# Patient Record
Sex: Female | Born: 1945 | Race: White | Hispanic: No | Marital: Married | Smoking: Former smoker
Health system: Southern US, Community
[De-identification: ages and names within clinical notes are randomized; demographics above are authoritative.]

## PROBLEM LIST (undated history)

## (undated) DIAGNOSIS — F419 Anxiety disorder, unspecified: Secondary | ICD-10-CM

## (undated) DIAGNOSIS — Z8719 Personal history of other diseases of the digestive system: Secondary | ICD-10-CM

## (undated) DIAGNOSIS — M1990 Unspecified osteoarthritis, unspecified site: Secondary | ICD-10-CM

## (undated) DIAGNOSIS — K219 Gastro-esophageal reflux disease without esophagitis: Secondary | ICD-10-CM

## (undated) DIAGNOSIS — M8580 Other specified disorders of bone density and structure, unspecified site: Secondary | ICD-10-CM

## (undated) HISTORY — PX: COLONOSCOPY: SHX174

## (undated) HISTORY — PX: LAPAROSCOPY: SHX197

## (undated) HISTORY — PX: TUBAL LIGATION: SHX77

---

## 1998-03-04 ENCOUNTER — Ambulatory Visit (HOSPITAL_COMMUNITY): Admission: RE | Admit: 1998-03-04 | Discharge: 1998-03-04 | Payer: Self-pay | Admitting: Surgery

## 1998-03-04 ENCOUNTER — Encounter: Payer: Self-pay | Admitting: Surgery

## 1999-06-17 HISTORY — PX: ABDOMINAL HYSTERECTOMY: SHX81

## 1999-07-01 ENCOUNTER — Encounter (INDEPENDENT_AMBULATORY_CARE_PROVIDER_SITE_OTHER): Payer: Self-pay

## 1999-07-01 ENCOUNTER — Inpatient Hospital Stay (HOSPITAL_COMMUNITY): Admission: RE | Admit: 1999-07-01 | Discharge: 1999-07-03 | Payer: Self-pay | Admitting: Obstetrics and Gynecology

## 2000-12-23 ENCOUNTER — Encounter: Payer: Self-pay | Admitting: Internal Medicine

## 2000-12-23 ENCOUNTER — Other Ambulatory Visit: Admission: RE | Admit: 2000-12-23 | Discharge: 2000-12-23 | Payer: Self-pay | Admitting: Obstetrics and Gynecology

## 2001-01-05 ENCOUNTER — Ambulatory Visit (HOSPITAL_COMMUNITY): Admission: RE | Admit: 2001-01-05 | Discharge: 2001-01-05 | Payer: Self-pay | Admitting: Internal Medicine

## 2001-01-05 ENCOUNTER — Encounter (INDEPENDENT_AMBULATORY_CARE_PROVIDER_SITE_OTHER): Payer: Self-pay

## 2001-01-05 ENCOUNTER — Encounter: Payer: Self-pay | Admitting: Internal Medicine

## 2002-04-07 ENCOUNTER — Other Ambulatory Visit: Admission: RE | Admit: 2002-04-07 | Discharge: 2002-04-07 | Payer: Self-pay | Admitting: Obstetrics and Gynecology

## 2003-05-02 ENCOUNTER — Other Ambulatory Visit: Admission: RE | Admit: 2003-05-02 | Discharge: 2003-05-02 | Payer: Self-pay | Admitting: Obstetrics and Gynecology

## 2004-05-02 ENCOUNTER — Other Ambulatory Visit: Admission: RE | Admit: 2004-05-02 | Discharge: 2004-05-02 | Payer: Self-pay | Admitting: Obstetrics and Gynecology

## 2005-05-06 ENCOUNTER — Other Ambulatory Visit: Admission: RE | Admit: 2005-05-06 | Discharge: 2005-05-06 | Payer: Self-pay | Admitting: Obstetrics and Gynecology

## 2006-03-10 ENCOUNTER — Ambulatory Visit: Payer: Self-pay | Admitting: Internal Medicine

## 2006-03-24 ENCOUNTER — Ambulatory Visit: Payer: Self-pay | Admitting: Internal Medicine

## 2006-05-20 ENCOUNTER — Other Ambulatory Visit: Admission: RE | Admit: 2006-05-20 | Discharge: 2006-05-20 | Payer: Self-pay | Admitting: Obstetrics and Gynecology

## 2007-08-25 ENCOUNTER — Other Ambulatory Visit: Admission: RE | Admit: 2007-08-25 | Discharge: 2007-08-25 | Payer: Self-pay | Admitting: Obstetrics and Gynecology

## 2008-10-12 ENCOUNTER — Other Ambulatory Visit: Admission: RE | Admit: 2008-10-12 | Discharge: 2008-10-12 | Payer: Self-pay | Admitting: Emergency Medicine

## 2008-10-12 ENCOUNTER — Ambulatory Visit: Payer: Self-pay | Admitting: Obstetrics and Gynecology

## 2008-10-12 ENCOUNTER — Encounter: Payer: Self-pay | Admitting: Obstetrics and Gynecology

## 2008-10-24 ENCOUNTER — Ambulatory Visit: Payer: Self-pay | Admitting: Obstetrics and Gynecology

## 2009-04-11 ENCOUNTER — Encounter (INDEPENDENT_AMBULATORY_CARE_PROVIDER_SITE_OTHER): Payer: Self-pay | Admitting: *Deleted

## 2009-04-11 ENCOUNTER — Encounter: Payer: Self-pay | Admitting: Internal Medicine

## 2009-05-24 DIAGNOSIS — K648 Other hemorrhoids: Secondary | ICD-10-CM | POA: Insufficient documentation

## 2009-05-24 DIAGNOSIS — K589 Irritable bowel syndrome without diarrhea: Secondary | ICD-10-CM | POA: Insufficient documentation

## 2009-05-24 DIAGNOSIS — M1990 Unspecified osteoarthritis, unspecified site: Secondary | ICD-10-CM | POA: Insufficient documentation

## 2009-05-24 DIAGNOSIS — K449 Diaphragmatic hernia without obstruction or gangrene: Secondary | ICD-10-CM | POA: Insufficient documentation

## 2009-05-24 DIAGNOSIS — K219 Gastro-esophageal reflux disease without esophagitis: Secondary | ICD-10-CM | POA: Insufficient documentation

## 2009-05-30 ENCOUNTER — Ambulatory Visit: Payer: Self-pay | Admitting: Internal Medicine

## 2009-05-30 DIAGNOSIS — R143 Flatulence: Secondary | ICD-10-CM

## 2009-05-30 DIAGNOSIS — R142 Eructation: Secondary | ICD-10-CM

## 2009-05-30 DIAGNOSIS — K5900 Constipation, unspecified: Secondary | ICD-10-CM | POA: Insufficient documentation

## 2009-05-30 DIAGNOSIS — R141 Gas pain: Secondary | ICD-10-CM

## 2009-06-19 ENCOUNTER — Telehealth (INDEPENDENT_AMBULATORY_CARE_PROVIDER_SITE_OTHER): Payer: Self-pay | Admitting: *Deleted

## 2009-09-04 ENCOUNTER — Ambulatory Visit: Payer: Self-pay | Admitting: Internal Medicine

## 2009-09-05 ENCOUNTER — Ambulatory Visit: Payer: Self-pay | Admitting: Internal Medicine

## 2009-09-06 ENCOUNTER — Encounter: Payer: Self-pay | Admitting: Internal Medicine

## 2009-09-11 ENCOUNTER — Ambulatory Visit (HOSPITAL_COMMUNITY): Admission: RE | Admit: 2009-09-11 | Discharge: 2009-09-11 | Payer: Self-pay | Admitting: Internal Medicine

## 2009-10-23 ENCOUNTER — Other Ambulatory Visit: Admission: RE | Admit: 2009-10-23 | Discharge: 2009-10-23 | Payer: Self-pay | Admitting: Obstetrics and Gynecology

## 2009-10-23 ENCOUNTER — Ambulatory Visit: Payer: Self-pay | Admitting: Obstetrics and Gynecology

## 2010-01-07 ENCOUNTER — Ambulatory Visit: Payer: Self-pay | Admitting: Obstetrics and Gynecology

## 2010-07-18 NOTE — Letter (Signed)
Summary: Patient [facility] Biopsy Results  [facility]  [address] [place], [place] [address]   Phone: [phone]  Fax: [phone]        September 06, 2009 MRN: [id number]    [facility] [address] [place], [place]  [address]    Dear Ms. Choyce,  I am pleased to inform you that the biopsies taken during your recent endoscopic examination did not show any evidence of cancer upon pathologic examination.The tissue biopsy from Your stomach confirmed gastritis, possibly due to Celebrex.  Additional information/recommendations:  __No further action is needed at this time.  Please follow-up with      your primary care physician for your other healthcare needs.  __ Please call [phone] to schedule a return visit to review      your condition.  _x_ Continue with the treatment plan as outlined on the day of your      exam.Try to reduce the Celebrex as much as possible to avoid further irritation of Your stomach.  _   Please call us if you are having persistent problems or have questions about your condition that have not been fully answered at this time.  Sincerely,  Hart Carwin MD  This letter has been electronically signed by your physician.  Appended Document: Patient Notice-Endo Biopsy Results Letter mailed 3.25.11

## 2010-07-18 NOTE — Procedures (Signed)
Summary: Upper Endoscopy  Patient: [name] Note: All result statuses are Final unless otherwise noted.  Tests: (1) Upper Endoscopy (EGD)   EGD Upper Endoscopy       DONE     [facility]     [address]     [place], [place]  [address]           ENDOSCOPY PROCEDURE REPORT           PATIENT:  [name], [name]  MR#:  [id number]     BIRTHDATE:  July 21, 1945, 63 yrs. old  GENDER:  female           ENDOSCOPIST:  Hedwig Morton. Juanda Chance, MD     Referred by:  Lupita Raider, M.D.           PROCEDURE DATE:  09/05/2009     PROCEDURE:  EGD with biopsy     ASA CLASS:  Class I     INDICATIONS:  dyspepsia, GERD EGD 2002 es.stricture, dilated     smoker     takes Celebrex     treated for H.Pylori 03/2009           MEDICATIONS:   Versed 4 mg, Fentanyl 50 mcg     TOPICAL ANESTHETIC:  Exactacain Spray           DESCRIPTION OF PROCEDURE:   After the risks benefits and     alternatives of the procedure were thoroughly explained, informed     consent was obtained.  The LB GIF-H180 [id number] endoscope was     introduced through the mouth and advanced to the second portion of     the duodenum, without limitations.  The instrument was slowly     withdrawn as the mucosa was fully examined.     <<PROCEDUREIMAGES>>           Mild gastritis was found in the antrum. minimal antral erythema,     no erosions With standard forceps, a biopsy was obtained and sent     to pathology. r/o H (see image2).pylori  Otherwise the examination     was normal (see image5, image4, image3, and image1). normal     esophagus, no stricture, no hiatal hernia    Retroflexed views     revealed no abnormalities.    The scope was then withdrawn from     the patient and the procedure completed.           COMPLICATIONS:  None           ENDOSCOPIC IMPRESSION:     1) Mild gastritis in the antrum     2) Otherwise normal examination     no stricture or esophagitis, mild gastritis r/o H.Pylori, r/o     Celebrex induced  gastropathy     RECOMMENDATIONS:     1) Await biopsy results     continue Nexiem 40 mg po qd     minimize Celebrex     Probiotic for constipation           REPEAT EXAM:  In 0 year(s) for.           ______________________________     Hedwig Morton. Juanda Chance, MD           CC:           n.     eSIGNED:   Hedwig Morton. Veera Stapleton at 09/05/2009 08:31 AM           Sabino Donovan, [id number]  Note: An  exclamation mark (!) indicates a result that was not dispersed into the flowsheet. Document Creation Date: 09/05/2009 8:32 AM _______________________________________________________________________  (1) Order result status: Final Collection or observation date-time: 09/05/2009 08:20 Requested date-time:  Receipt date-time:  Reported date-time:  Referring Physician:   Ordering Physician: Lina Sar [phone]) Specimen Source:  Source: Launa Grill Order Number: [id number] Lab site:

## 2010-07-18 NOTE — Progress Notes (Signed)
Summary: LM for pt to call and make F/U appt with Dr. Juanda Chance  Phone Note Outgoing Call   Call placed by: Joselyn Glassman,  June 19, 2009 9:38 AM Call placed to: Patient Summary of Call: LM on home ans machine to advise she had seen Willette Cluster RNP on 05-30-09 Gunnar Fusi suggested the pt f/u with Dr. Juanda Chance.  I let her know there is nothing available in Jan 11 but there are a few openings early Feb 11.  I urged her to call us and make her appt to F/U with Dr. Juanda Chance. Initial call taken by: Joselyn Glassman,  June 19, 2009 9:40 AM

## 2010-07-18 NOTE — Letter (Signed)
Summary: EGD Instructions  [name]  [address] [place], [place] [address]   Phone: [phone]  Fax: [phone]       [name]    1945-08-20    MRN: [id number]       Procedure Day /Date: 09/05/09 Wednesday     Arrival Time: 7:30 am     Procedure Time: 8:00 am     Location of Procedure:                    _x  _ [facility] (4th Floor)   PREPARATION FOR ENDOSCOPY   On 09/05/09 THE DAY OF THE PROCEDURE:  1.   No solid foods, milk or milk products are allowed after midnight the night before your procedure.  2.   Do not drink anything colored red or purple.  Avoid juices with pulp.  No orange juice.  3.  You may drink clear liquids until 6:00 am, which is 2 hours before your procedure.                                                                                                CLEAR LIQUIDS INCLUDE: Water Jello Ice Popsicles Tea (sugar ok, no milk/cream) Powdered fruit flavored drinks Coffee (sugar ok, no milk/cream) Gatorade Juice: apple, white grape, white cranberry  Lemonade Clear bullion, consomm, broth Carbonated beverages (any kind) Strained chicken noodle soup Hard Candy   MEDICATION INSTRUCTIONS  Unless otherwise instructed, you should take regular prescription medications with a small sip of water as early as possible the morning of your procedure.                  OTHER INSTRUCTIONS  You will need a responsible adult at least 64 years of age to accompany you and drive you home.   This person must remain in the waiting room during your procedure.  Wear loose fitting clothing that is easily removed.  Leave jewelry and other valuables at home.  However, you may wish to bring a book to read or an iPod/MP3 player to listen to music as you wait for your procedure to start.  Remove all body piercing jewelry and leave at home.  Total time from sign-in until discharge is approximately 2-3 hours.  You should  go home directly after your procedure and rest.  You can resume normal activities the day after your procedure.  The day of your procedure you should not:   Drive   Make legal decisions   Operate machinery   Drink alcohol   Return to work  You will receive specific instructions about eating, activities and medications before you leave.    The above instructions have been reviewed and explained to me by  Hortense Ramal CMA Duncan Dull)  September 04, 2009 11:13 AM     I fully understand and can verbalize these instructions _____________________________ Date 09/04/09

## 2010-07-18 NOTE — Assessment & Plan Note (Signed)
Summary: f.u.,...em   History of Present Illness Visit Type: Follow-up Visit Primary GI MD: Lina Sar MD Primary Provider: Lupita Raider, MD  Requesting Provider: n/a Chief Complaint: F/u for GERD. Pt states that she feels better with Nexium and denies any GI complaints  History of Present Illness:   This is a 65 white female with bloating, fullness and abdominal distention which occurs almost on a daily basis. She has a history of gastroesophageal reflux disease, irritable bowel syndrome and she had a 3 cm hiatal hernia on an endoscopy in February 2002. She at that time had a nonobstructing esophageal stricture which was dilated. Her last colonoscopy in October 2007 was normal;  she continues to smoke. She has recently switched from Dexilant to Nexium 40 mg daily with marked improvement of her symptoms. Her weight has increased about 10-12 pounds. She was treated for a positive H. pylori antibody in the fall of 2010.   GI Review of Systems      Denies abdominal pain, acid reflux, belching, bloating, chest pain, dysphagia with liquids, dysphagia with solids, heartburn, loss of appetite, nausea, vomiting, vomiting blood, weight loss, and  weight gain.        Denies anal fissure, black tarry stools, change in bowel habit, constipation, diarrhea, diverticulosis, fecal incontinence, heme positive stool, hemorrhoids, irritable bowel syndrome, jaundice, light color stool, liver problems, rectal bleeding, and  rectal pain.    Current Medications (verified): 1)  Estradiol 1 Mg Tabs (Estradiol) .... Take 1 Tablet By Mouth Once A Day 2)  Actifed Cold/allergy 4-10 Mg Tabs (Chlorpheniramine-Phenylephrine) .... Use As Directed 3)  Celebrex 200 Mg Caps (Celecoxib) .... Take 1 Capsule By Mouth Once Daily 4)  Vitamin D 2000 Unit Tabs (Cholecalciferol) .... Take As Directed 5)  Fish Oil 1000 Mg Caps (Omega-3 Fatty Acids) .... Take 1 Capsule By Mouth Once Daily 6)  Xanax 0.25 Mg Tabs (Alprazolam) ....  Take 1/2-1 Tablet By Mouth Once Daily As Needed For Anxiety 7)  Nexium 40 Mg Cpdr (Esomeprazole Magnesium) .... One Tablet By Mouth Once Daily 8)  Miralax  Powd (Polyethylene Glycol 3350) .... As Needed 9)  Krill Oil 1000 Mg Caps (Krill Oil) .... One Tablet By Mouth Once Daily 10)  Centrum Silver  Tabs (Multiple Vitamins-Minerals) .... One Tablet By Mouth Once Daily 11)  Align  Caps (Probiotic Product) .... Take 1 Capsule X 14 Days 12)  Vitamin B-12 1000 Mcg Tabs (Cyanocobalamin) .... One Tablet By Mouth Once Daily 13)  Bee Pollen 580 Mg Caps (Bee Pollen) .... One Tablet By Mouth Two Times A Day  Allergies (verified): 1)  Codeine  Past History:  Past Medical History: Reviewed history from 05/24/2009 and no changes required. Current Problems:  INTERNAL HEMORRHOIDS (ICD-455.0) OSTEOARTHRITIS (ICD-715.90) IRRITABLE BOWEL SYNDROME (ICD-564.1) GERD (ICD-530.81) HIATAL HERNIA (ICD-553.3)    Past Surgical History: Reviewed history from 05/24/2009 and no changes required. Tubal Ligation Total Hysterectomy  Family History: Reviewed history from 05/24/2009 and no changes required. No FH of Colon Cancer: Family History of Diabetes: Uncle  Social History: Reviewed history from 05/24/2009 and no changes required. Patient currently smokes.  Alcohol Use - yes Illicit Drug Use - no  Review of Systems  The patient denies allergy/sinus, anemia, anxiety-new, arthritis/joint pain, back pain, blood in urine, breast changes/lumps, change in vision, confusion, cough, coughing up blood, depression-new, fainting, fatigue, fever, headaches-new, hearing problems, heart murmur, heart rhythm changes, itching, menstrual pain, muscle pains/cramps, night sweats, nosebleeds, pregnancy symptoms, shortness of breath, skin rash, sleeping  problems, sore throat, swelling of feet/legs, swollen lymph glands, thirst - excessive , urination - excessive , urination changes/pain, urine leakage, vision changes, and  voice change.         Pertinent positive and negative review of systems were noted in the above HPI. All other ROS was otherwise negative.   Vital Signs:  Patient profile:   65 year old female Height:      64 inches Weight:      138 pounds BMI:     23.77 BSA:     1.67 Pulse rate:   68 / minute Pulse rhythm:   regular BP sitting:   120 / 74  (left arm) Cuff size:   regular  Vitals Entered By: Ok Anis CMA (September 04, 2009 10:08 AM)  Physical Exam  General:  Well developed, well nourished, no acute distress. Mouth:  No deformity or lesions, dentition normal. Neck:  Supple; no masses or thyromegaly. Lungs:  Clear throughout to auscultation. Heart:  Regular rate and rhythm; no murmurs, rubs,  or bruits. Abdomen:  soft mildly protuberant. Decreased muscle tone. Normal active bowel sounds. No bruit. Liver edge at costal margin. No palpable mass or tenderness. Rectal:  soft trace Hemoccult-positive stool. Extremities:  No clubbing, cyanosis, edema or deformities noted. Skin:  Intact without significant lesions or rashes. Psych:  Alert and cooperative. Normal mood and affect.   Impression & Recommendations:  Problem # 1:  CONSTIPATION (ICD-564.00) Patient will need to increase her fiber intake and continue on probiotics. She should also purchase over-the-counter fiber supplements.  Problem # 2:  FLATULENCE-GAS-BLOATING (ICD-787.3) Patient has a history of irritable bowel syndrome. We need to rule out biliary dysfunction. We will schedule her for an upper abdominal ultrasound at this time.  Orders: Ultrasound Abdomen (UAS)  Problem # 3:  GERD (ICD-530.81) Patient's GERD has improved on Nexium 40 mg daily. We will schedule her for an upper endoscopy to further evaluate her Hemoccult-positive stool. She is up-to-date on her colonoscopy at this time. If the upper endoscopy is negative, I would think that her Hemoccult-positive stool is due to her internal  hemorrhoids.  Orders: EGD (EGD)  Patient Instructions: 1)  Please come for your scheduled endoscopy on 09/05/09 @ 8 am. Arrive at 7:30 a on the 4th floor of [facility]. 2)  Nexium 40 mg daily 3)  Continue probiotic daily 4)  Continue over-the-counter Gas-X or Gaviscon p.r.n. bloating 5)  Stop smoking 6)  Increase exercise to lose  weight loss of 10-12 pounds 7)  Please go to [facility] for your scheduled abdominal ultrasound on 09/11/09 (Tuesday). You will need to have no food or drink after midnight the night before your test. You will also need to arrive 15 minutes prior to your appointment for registration. 8)  Please read and follow instructions as given to you in the gas brochure. 9)  Copy sent to : Dr Kirtland Bouchard. Shaw 10)  The medication list was reviewed and reconciled.  All changed / newly prescribed medications were explained.  A complete medication list was provided to the patient / caregiver.

## 2010-11-01 NOTE — Procedures (Signed)
[facility]  Patient:    [name], [name]                         MRN: [id number] Proc. Date: 01/05/01 Attending:  Hedwig Morton. Juanda Chance, M.D. [facility] CC:         Illene Labrador. [place], M.D.   Procedure Report  PROCEDURE:  Upper endoscopy.  INDICATIONS FOR PROCEDURE:  This 65 year old white female has a complaint of chronic gastroesophageal reflux disease.  She has taken H2 receptor antagonist with some improvement until most recently Prilosec 20 mg q.d. which has completely controlled her symptoms.  She is on Celebrex on a daily basis because of right hip pain.  Denies any solid food dysphagia, but has had more reflux postprandially.  She is now undergoing upper endoscopy to rule out Barretts esophagus.  ENDOSCOPE:  Olympus ______ scope.  SEDATION:  Versed 8 mg intravenously.  DESCRIPTION OF PROCEDURE:  Olympus single channel videoscope passed into the posterior pharynx into the esophagus.  The patient was monitored by pulse oximetry.  Oxygen saturations were normal.  Esophageal mucosa was unremarkable.  There was a fibrous ring at the GE junction 35 cm from the incisors.  It was a nonobstructing early esophageal stricture.  There were no acute erosions.  Distal to the stricture was a nonreducible hiatal hernia measuring 3 cm, extending from 35 to 38 cm from the incisors.  Mucosa of the hiatal hernia was normal.  Biopsies were taken from GE junction to rule out Barretts esophagus.  Stomach was insufflated with air and showed normal appearing gastric folds, gastric antrum, and pyloric outlet.  CLO test was taken from gastric antrum. Retroflexion of endoscope confirmed presence of hiatal hernia.  Duodenum, duodenal bulb, and descending duodenum was normal.  A Maloney dilator 48 [place] was passed blindly through the esophagus without resistance.  The patient tolerated the procedure well.  IMPRESSION:  Hiatal hernia with nonobstructing esophageal stricture,  status post passage of 48 French Maloney dilator, status post biopsies and CLO test.  PLAN:  The patient clearly has a symptomatic gastroesophageal reflux disease with some development of chronic changes in the esophagus indicating ongoing reflux.  She will have to stay on a proton pump inhibitor indefinitely, as well as anti-reflux measures.  If she has break through symptoms, may have to increase the proton pump inhibitor or consider a Nissen fundoplication. DD:  01/05/01 TD:  01/05/01 Job: [id number]

## 2012-05-28 ENCOUNTER — Encounter: Payer: Self-pay | Admitting: Obstetrics and Gynecology

## 2012-08-27 ENCOUNTER — Other Ambulatory Visit (HOSPITAL_COMMUNITY): Payer: Self-pay | Admitting: Orthopaedic Surgery

## 2012-09-06 ENCOUNTER — Encounter (HOSPITAL_COMMUNITY): Payer: Self-pay | Admitting: Pharmacy Technician

## 2012-09-10 ENCOUNTER — Other Ambulatory Visit (HOSPITAL_COMMUNITY): Payer: Self-pay | Admitting: Orthopaedic Surgery

## 2012-09-13 ENCOUNTER — Encounter (HOSPITAL_COMMUNITY)
Admission: RE | Admit: 2012-09-13 | Discharge: 2012-09-13 | Disposition: A | Discharge status: Home / Self Care | Payer: Medicare Other | Source: Ambulatory Visit | Attending: Orthopaedic Surgery | Admitting: Orthopaedic Surgery

## 2012-09-13 ENCOUNTER — Encounter (HOSPITAL_COMMUNITY): Payer: Self-pay

## 2012-09-13 HISTORY — DX: Personal history of other diseases of the digestive system: Z87.19

## 2012-09-13 HISTORY — DX: Anxiety disorder, unspecified: F41.9

## 2012-09-13 HISTORY — DX: Other specified disorders of bone density and structure, unspecified site: M85.80

## 2012-09-13 HISTORY — DX: Gastro-esophageal reflux disease without esophagitis: K21.9

## 2012-09-13 HISTORY — DX: Unspecified osteoarthritis, unspecified site: M19.90

## 2012-09-13 LAB — URINALYSIS, ROUTINE W REFLEX MICROSCOPIC
Hgb urine dipstick: NEGATIVE
Nitrite: NEGATIVE
Protein, ur: NEGATIVE mg/dL
Specific Gravity, Urine: 1.018 (ref 1.005–1.030)
Urobilinogen, UA: 0.2 mg/dL (ref 0.0–1.0)

## 2012-09-13 LAB — URINE MICROSCOPIC-ADD ON

## 2012-09-13 LAB — BASIC METABOLIC PANEL
Chloride: 99 mEq/L (ref 96–112)
Creatinine, Ser: 0.49 mg/dL — ABNORMAL LOW (ref 0.50–1.10)
GFR calc Af Amer: >90 mL/min (ref >90)
GFR calc non Af Amer: >90 mL/min (ref >90)

## 2012-09-13 LAB — CBC
HCT: 42 % (ref 36.0–46.0)
Platelets: 232 10*3/uL (ref 150–400)
RDW: 12.4 % (ref 11.5–15.5)
WBC: 8.1 10*3/uL (ref 4.0–10.5)

## 2012-09-13 LAB — ABO/RH: ABO/RH(D): A NEG

## 2012-09-13 NOTE — Patient Instructions (Addendum)
KALAYAH LESKE  09/13/2012                           YOUR PROCEDURE IS SCHEDULED ON: 4/4               PLEASE REPORT TO SHORT STAY CENTER AT : 12:15 PM               CALL THIS NUMBER IF ANY PROBLEMS THE DAY OF SURGERY  [phone]                      REMEMBER:   Do not eat food or drink liquids AFTER MIDNIGHT  May have clear liquids UNTIL 6 HOURS BEFORE SURGERY (8:45 AM)  Clear liquids include soda, tea, black coffee, apple or grape juice, broth.  Take these medicines the morning of surgery with A SIP OF WATER: PROTONIX / TAKE XANAX , VICODIN IF NEEDED   Do not wear jewelry, make-up   Do not wear lotions, powders, or perfumes.   Do not shave legs or underarms 12 hrs. before surgery (men may shave face)  Do not bring valuables to the hospital.  Contacts, dentures or bridgework may not be worn into surgery.  Leave suitcase in the car. After surgery it may be brought to your room.  For patients admitted to the hospital more than one night, checkout time is 11:00                          The day of discharge.   Patients discharged the day of surgery will not be allowed to drive home                             If going home same day of surgery, must have someone stay with you first                           24 hrs at home and arrange for some one to drive you home from hospital.    Special Instructions:   Please read over the following fact sheets that you were given:               1. MRSA  INFORMATION                      2. [place] PREPARING FOR SURGERY SHEET                                                X_____________________________________________________________________        Failure to follow these instructions may result in cancellation of your surgery

## 2012-09-14 LAB — URINE CULTURE
Colony Count: NO GROWTH
Culture: NO GROWTH

## 2012-09-17 ENCOUNTER — Encounter (HOSPITAL_COMMUNITY)
Admission: RE | Disposition: A | Discharge status: Home Health | Payer: Self-pay | Source: Ambulatory Visit | Attending: Orthopaedic Surgery

## 2012-09-17 ENCOUNTER — Inpatient Hospital Stay (HOSPITAL_COMMUNITY): Payer: Medicare Other

## 2012-09-17 ENCOUNTER — Inpatient Hospital Stay (HOSPITAL_COMMUNITY)
Admission: RE | Admit: 2012-09-17 | Discharge: 2012-09-20 | DRG: 470 | Disposition: A | Discharge status: Home Health | Payer: Medicare Other | Source: Ambulatory Visit | Attending: Orthopaedic Surgery | Admitting: Orthopaedic Surgery

## 2012-09-17 ENCOUNTER — Encounter (HOSPITAL_COMMUNITY): Payer: Self-pay | Admitting: Anesthesiology

## 2012-09-17 ENCOUNTER — Inpatient Hospital Stay (HOSPITAL_COMMUNITY): Payer: Medicare Other | Admitting: Anesthesiology

## 2012-09-17 ENCOUNTER — Encounter (HOSPITAL_COMMUNITY): Payer: Self-pay | Admitting: *Deleted

## 2012-09-17 DIAGNOSIS — Z01812 Encounter for preprocedural laboratory examination: Secondary | ICD-10-CM

## 2012-09-17 DIAGNOSIS — M169 Osteoarthritis of hip, unspecified: Secondary | ICD-10-CM

## 2012-09-17 DIAGNOSIS — M1610 Unilateral primary osteoarthritis, unspecified hip: Principal | ICD-10-CM | POA: Diagnosis present

## 2012-09-17 DIAGNOSIS — K219 Gastro-esophageal reflux disease without esophagitis: Secondary | ICD-10-CM | POA: Diagnosis present

## 2012-09-17 DIAGNOSIS — K449 Diaphragmatic hernia without obstruction or gangrene: Secondary | ICD-10-CM | POA: Diagnosis present

## 2012-09-17 DIAGNOSIS — M949 Disorder of cartilage, unspecified: Secondary | ICD-10-CM | POA: Diagnosis present

## 2012-09-17 DIAGNOSIS — F411 Generalized anxiety disorder: Secondary | ICD-10-CM | POA: Diagnosis present

## 2012-09-17 DIAGNOSIS — M899 Disorder of bone, unspecified: Secondary | ICD-10-CM | POA: Diagnosis present

## 2012-09-17 HISTORY — PX: TOTAL HIP ARTHROPLASTY: SHX124

## 2012-09-17 LAB — TYPE AND SCREEN
ABO/RH(D): A NEG
Antibody Screen: NEGATIVE

## 2012-09-17 SURGERY — ARTHROPLASTY, HIP, TOTAL, ANTERIOR APPROACH
Anesthesia: General | Site: Hip | Laterality: Right | Wound class: Clean

## 2012-09-17 MED ORDER — ALPRAZOLAM 0.25 MG PO TABS
0.2500 mg | ORAL_TABLET | Freq: Every day | ORAL | Status: DC
  Administered 2012-09-18 – 2012-09-19 (×2): 0.25 mg via ORAL
  Filled 2012-09-17 (×2): qty 1

## 2012-09-17 MED ORDER — 0.9 % SODIUM CHLORIDE (POUR BTL) OPTIME
TOPICAL | Status: DC | PRN
  Administered 2012-09-17: 1000 mL

## 2012-09-17 MED ORDER — ACETAMINOPHEN 10 MG/ML IV SOLN
INTRAVENOUS | Status: DC | PRN
  Administered 2012-09-17: 1000 mg via INTRAVENOUS

## 2012-09-17 MED ORDER — SODIUM CHLORIDE 0.9 % IR SOLN
Status: DC | PRN
  Administered 2012-09-17: 1000 mL

## 2012-09-17 MED ORDER — ZOLPIDEM TARTRATE 5 MG PO TABS: 5.0000 mg | ORAL_TABLET | Freq: Every evening | ORAL | Status: DC | PRN

## 2012-09-17 MED ORDER — PROPOFOL 10 MG/ML IV EMUL
INTRAVENOUS | Status: DC | PRN
  Administered 2012-09-17: 150 mg via INTRAVENOUS

## 2012-09-17 MED ORDER — DOCUSATE SODIUM 100 MG PO CAPS
100.0000 mg | ORAL_CAPSULE | Freq: Two times a day (BID) | ORAL | Status: DC
  Administered 2012-09-17 – 2012-09-20 (×6): 100 mg via ORAL

## 2012-09-17 MED ORDER — PHENOL 1.4 % MT LIQD: 1.0000 | OROMUCOSAL | Status: DC | PRN

## 2012-09-17 MED ORDER — MENTHOL 3 MG MT LOZG: 1.0000 | LOZENGE | OROMUCOSAL | Status: DC | PRN

## 2012-09-17 MED ORDER — ROCURONIUM BROMIDE 100 MG/10ML IV SOLN
INTRAVENOUS | Status: DC | PRN
  Administered 2012-09-17: 40 mg via INTRAVENOUS

## 2012-09-17 MED ORDER — CEFAZOLIN SODIUM-DEXTROSE 2-3 GM-% IV SOLR
2.0000 g | INTRAVENOUS | Status: AC
  Administered 2012-09-17: 2 g via INTRAVENOUS

## 2012-09-17 MED ORDER — HYDROMORPHONE HCL PF 1 MG/ML IJ SOLN
1.0000 mg | INTRAMUSCULAR | Status: DC | PRN
  Administered 2012-09-17 – 2012-09-18 (×2): 1 mg via INTRAVENOUS
  Filled 2012-09-17 (×2): qty 1

## 2012-09-17 MED ORDER — EPHEDRINE SULFATE 50 MG/ML IJ SOLN
INTRAMUSCULAR | Status: DC | PRN
  Administered 2012-09-17: 10 mg via INTRAVENOUS

## 2012-09-17 MED ORDER — SODIUM CHLORIDE 0.9 % IV SOLN
INTRAVENOUS | Status: DC
  Administered 2012-09-17: 1000 mL via INTRAVENOUS
  Administered 2012-09-18: 07:00:00 via INTRAVENOUS

## 2012-09-17 MED ORDER — ASPIRIN EC 325 MG PO TBEC
325.0000 mg | DELAYED_RELEASE_TABLET | Freq: Two times a day (BID) | ORAL | Status: DC
  Administered 2012-09-18 – 2012-09-20 (×5): 325 mg via ORAL
  Filled 2012-09-17 (×7): qty 1

## 2012-09-17 MED ORDER — GLYCOPYRROLATE 0.2 MG/ML IJ SOLN
INTRAMUSCULAR | Status: DC | PRN
  Administered 2012-09-17: 0.1 mg via INTRAVENOUS
  Administered 2012-09-17: 0.6 mg via INTRAVENOUS

## 2012-09-17 MED ORDER — ALUM & MAG HYDROXIDE-SIMETH 200-200-20 MG/5ML PO SUSP: 30.0000 mL | ORAL | Status: DC | PRN

## 2012-09-17 MED ORDER — ADULT MULTIVITAMIN W/MINERALS CH
1.0000 | ORAL_TABLET | Freq: Every day | ORAL | Status: DC
  Administered 2012-09-18 – 2012-09-20 (×3): 1 via ORAL
  Filled 2012-09-17 (×5): qty 1

## 2012-09-17 MED ORDER — FENTANYL CITRATE 0.05 MG/ML IJ SOLN
INTRAMUSCULAR | Status: DC | PRN
  Administered 2012-09-17 (×3): 50 ug via INTRAVENOUS
  Administered 2012-09-17: 100 ug via INTRAVENOUS

## 2012-09-17 MED ORDER — PHENYLEPHRINE HCL 10 MG/ML IJ SOLN
INTRAMUSCULAR | Status: DC | PRN
  Administered 2012-09-17 (×3): 80 ug via INTRAVENOUS

## 2012-09-17 MED ORDER — METOCLOPRAMIDE HCL 10 MG PO TABS: 5.0000 mg | ORAL_TABLET | Freq: Three times a day (TID) | ORAL | Status: DC | PRN

## 2012-09-17 MED ORDER — ONDANSETRON HCL 4 MG/2ML IJ SOLN: 4.0000 mg | Freq: Four times a day (QID) | INTRAMUSCULAR | Status: DC | PRN

## 2012-09-17 MED ORDER — HYDROMORPHONE HCL PF 1 MG/ML IJ SOLN
0.2500 mg | INTRAMUSCULAR | Status: DC | PRN
  Administered 2012-09-17 (×3): 0.5 mg via INTRAVENOUS

## 2012-09-17 MED ORDER — DIPHENHYDRAMINE HCL 12.5 MG/5ML PO ELIX: 12.5000 mg | ORAL_SOLUTION | ORAL | Status: DC | PRN

## 2012-09-17 MED ORDER — CEFAZOLIN SODIUM 1-5 GM-% IV SOLN
1.0000 g | Freq: Four times a day (QID) | INTRAVENOUS | Status: AC
  Administered 2012-09-17 – 2012-09-18 (×2): 1 g via INTRAVENOUS
  Filled 2012-09-17 (×2): qty 50

## 2012-09-17 MED ORDER — STERILE WATER FOR IRRIGATION IR SOLN
Status: DC | PRN
  Administered 2012-09-17: 3000 mL

## 2012-09-17 MED ORDER — HYDROMORPHONE HCL PF 1 MG/ML IJ SOLN
INTRAMUSCULAR | Status: AC
  Filled 2012-09-17: qty 1

## 2012-09-17 MED ORDER — ONDANSETRON HCL 4 MG/2ML IJ SOLN
INTRAMUSCULAR | Status: DC | PRN
  Administered 2012-09-17: 4 mg via INTRAVENOUS

## 2012-09-17 MED ORDER — FERROUS SULFATE 325 (65 FE) MG PO TABS
325.0000 mg | ORAL_TABLET | Freq: Three times a day (TID) | ORAL | Status: DC
  Administered 2012-09-18 – 2012-09-20 (×7): 325 mg via ORAL
  Filled 2012-09-17 (×11): qty 1

## 2012-09-17 MED ORDER — LACTATED RINGERS IV SOLN
INTRAVENOUS | Status: DC
  Administered 2012-09-17: 1000 mL via INTRAVENOUS

## 2012-09-17 MED ORDER — HYDROMORPHONE HCL PF 1 MG/ML IJ SOLN
INTRAMUSCULAR | Status: DC | PRN
  Administered 2012-09-17: 0.5 mg via INTRAVENOUS
  Administered 2012-09-17: 1 mg via INTRAVENOUS
  Administered 2012-09-17: 0.5 mg via INTRAVENOUS

## 2012-09-17 MED ORDER — LACTATED RINGERS IV SOLN
INTRAVENOUS | Status: DC | PRN
  Administered 2012-09-17: 14:00:00 via INTRAVENOUS

## 2012-09-17 MED ORDER — OXYCODONE HCL ER 10 MG PO T12A
10.0000 mg | EXTENDED_RELEASE_TABLET | Freq: Two times a day (BID) | ORAL | Status: DC
  Administered 2012-09-17 – 2012-09-20 (×6): 10 mg via ORAL
  Filled 2012-09-17 (×6): qty 1

## 2012-09-17 MED ORDER — LACTATED RINGERS IV SOLN: INTRAVENOUS | Status: DC

## 2012-09-17 MED ORDER — SUCCINYLCHOLINE CHLORIDE 20 MG/ML IJ SOLN
INTRAMUSCULAR | Status: DC | PRN
  Administered 2012-09-17: 100 mg via INTRAVENOUS

## 2012-09-17 MED ORDER — METHOCARBAMOL 500 MG PO TABS
500.0000 mg | ORAL_TABLET | Freq: Four times a day (QID) | ORAL | Status: DC | PRN
  Administered 2012-09-17 – 2012-09-20 (×7): 500 mg via ORAL
  Filled 2012-09-17 (×8): qty 1

## 2012-09-17 MED ORDER — METOCLOPRAMIDE HCL 5 MG/ML IJ SOLN: 5.0000 mg | Freq: Three times a day (TID) | INTRAMUSCULAR | Status: DC | PRN

## 2012-09-17 MED ORDER — DEXAMETHASONE SODIUM PHOSPHATE 10 MG/ML IJ SOLN
INTRAMUSCULAR | Status: DC | PRN
  Administered 2012-09-17: 10 mg via INTRAVENOUS

## 2012-09-17 MED ORDER — NEOSTIGMINE METHYLSULFATE 1 MG/ML IJ SOLN
INTRAMUSCULAR | Status: DC | PRN
  Administered 2012-09-17: 5 mg via INTRAVENOUS

## 2012-09-17 MED ORDER — MIDAZOLAM HCL 5 MG/5ML IJ SOLN
INTRAMUSCULAR | Status: DC | PRN
  Administered 2012-09-17: 2 mg via INTRAVENOUS

## 2012-09-17 MED ORDER — ONDANSETRON HCL 4 MG PO TABS: 4.0000 mg | ORAL_TABLET | Freq: Four times a day (QID) | ORAL | Status: DC | PRN

## 2012-09-17 MED ORDER — PROMETHAZINE HCL 25 MG/ML IJ SOLN: 6.2500 mg | INTRAMUSCULAR | Status: DC | PRN

## 2012-09-17 MED ORDER — PANTOPRAZOLE SODIUM 40 MG PO TBEC
40.0000 mg | DELAYED_RELEASE_TABLET | Freq: Every day | ORAL | Status: DC
  Administered 2012-09-18 – 2012-09-20 (×3): 40 mg via ORAL
  Filled 2012-09-17 (×4): qty 1

## 2012-09-17 MED ORDER — OXYCODONE HCL 5 MG PO TABS
5.0000 mg | ORAL_TABLET | ORAL | Status: DC | PRN
  Administered 2012-09-17: 5 mg via ORAL
  Administered 2012-09-18 (×3): 10 mg via ORAL
  Administered 2012-09-18: 5 mg via ORAL
  Administered 2012-09-18: 10 mg via ORAL
  Administered 2012-09-19: 5 mg via ORAL
  Administered 2012-09-19: 10 mg via ORAL
  Administered 2012-09-19 (×2): 5 mg via ORAL
  Administered 2012-09-19 – 2012-09-20 (×3): 10 mg via ORAL
  Filled 2012-09-17 (×2): qty 2
  Filled 2012-09-17: qty 1
  Filled 2012-09-17 (×2): qty 2
  Filled 2012-09-17: qty 1
  Filled 2012-09-17 (×3): qty 2
  Filled 2012-09-17: qty 1
  Filled 2012-09-17 (×2): qty 2

## 2012-09-17 MED ORDER — METHOCARBAMOL 100 MG/ML IJ SOLN
500.0000 mg | Freq: Four times a day (QID) | INTRAVENOUS | Status: DC | PRN
  Filled 2012-09-17: qty 5

## 2012-09-17 MED ORDER — ACETAMINOPHEN 650 MG RE SUPP: 650.0000 mg | Freq: Four times a day (QID) | RECTAL | Status: DC | PRN

## 2012-09-17 MED ORDER — ACETAMINOPHEN 325 MG PO TABS
650.0000 mg | ORAL_TABLET | Freq: Four times a day (QID) | ORAL | Status: DC | PRN
  Administered 2012-09-18 – 2012-09-19 (×3): 650 mg via ORAL
  Filled 2012-09-17 (×3): qty 2

## 2012-09-17 MED ORDER — LIDOCAINE HCL (CARDIAC) 20 MG/ML IV SOLN
INTRAVENOUS | Status: DC | PRN
  Administered 2012-09-17: 100 mg via INTRAVENOUS

## 2012-09-17 SURGICAL SUPPLY — 35 items
BAG ZIPLOCK 12X15 (MISCELLANEOUS) ×4 IMPLANT
BLADE SAW SGTL 18X1.27X75 (BLADE) ×2 IMPLANT
CELLS DAT CNTRL 66122 CELL SVR (MISCELLANEOUS) ×1 IMPLANT
CLOTH BEACON ORANGE TIMEOUT ST (SAFETY) ×2 IMPLANT
DERMABOND ADVANCED (GAUZE/BANDAGES/DRESSINGS) ×1
DERMABOND ADVANCED .7 DNX12 (GAUZE/BANDAGES/DRESSINGS) ×1 IMPLANT
DRAPE C-ARM 42X72 X-RAY (DRAPES) ×2 IMPLANT
DRAPE STERI IOBAN 125X83 (DRAPES) ×2 IMPLANT
DRAPE U-SHAPE 47X51 STRL (DRAPES) ×6 IMPLANT
DRSG AQUACEL AG ADV 3.5X10 (GAUZE/BANDAGES/DRESSINGS) ×2 IMPLANT
DURAPREP 26ML APPLICATOR (WOUND CARE) ×2 IMPLANT
ELECT BLADE TIP CTD 4 INCH (ELECTRODE) ×2 IMPLANT
ELECT REM PT RETURN 9FT ADLT (ELECTROSURGICAL) ×2
ELECTRODE REM PT RTRN 9FT ADLT (ELECTROSURGICAL) ×1 IMPLANT
FACESHIELD LNG OPTICON STERILE (SAFETY) ×8 IMPLANT
GLOVE BIO SURGEON STRL SZ7.5 (GLOVE) ×2 IMPLANT
GLOVE BIOGEL PI IND STRL 8 (GLOVE) ×3 IMPLANT
GLOVE BIOGEL PI INDICATOR 8 (GLOVE) ×3
GLOVE ECLIPSE 8.0 STRL XLNG CF (GLOVE) ×4 IMPLANT
GOWN STRL REIN XL XLG (GOWN DISPOSABLE) ×4 IMPLANT
HANDPIECE INTERPULSE COAX TIP (DISPOSABLE) ×1
KIT BASIN OR (CUSTOM PROCEDURE TRAY) ×2 IMPLANT
PACK TOTAL JOINT (CUSTOM PROCEDURE TRAY) ×2 IMPLANT
PADDING CAST COTTON 6X4 STRL (CAST SUPPLIES) ×2 IMPLANT
RTRCTR WOUND ALEXIS 18CM MED (MISCELLANEOUS) ×2
SET HNDPC FAN SPRY TIP SCT (DISPOSABLE) ×1 IMPLANT
SUT ETHIBOND NAB CT1 #1 30IN (SUTURE) ×4 IMPLANT
SUT MNCRL AB 4-0 PS2 18 (SUTURE) ×2 IMPLANT
SUT NYLON 3 0 (SUTURE) ×2 IMPLANT
SUT VIC AB 1 CT1 36 (SUTURE) ×4 IMPLANT
SUT VIC AB 2-0 CT1 27 (SUTURE) ×2
SUT VIC AB 2-0 CT1 TAPERPNT 27 (SUTURE) ×2 IMPLANT
TOWEL OR 17X26 10 PK STRL BLUE (TOWEL DISPOSABLE) ×4 IMPLANT
TOWEL OR NON WOVEN STRL DISP B (DISPOSABLE) ×2 IMPLANT
TRAY FOLEY CATH 14FRSI W/METER (CATHETERS) ×2 IMPLANT

## 2012-09-17 NOTE — Plan of Care (Signed)
Problem: Consults Goal: Diagnosis- Total Joint Replacement Anterior Right Total Hip Replacement

## 2012-09-17 NOTE — Anesthesia Preprocedure Evaluation (Addendum)
Anesthesia Evaluation  Patient identified by MRN, date of birth, ID band Patient awake    Reviewed: Allergy & Precautions, H&P , NPO status , Patient's Chart, lab work & pertinent test results  Airway Mallampati: II TM Distance: >3 FB Neck ROM: Full    Dental  (+) Teeth Intact and Dental Advisory Given   Pulmonary neg pulmonary ROS, former smoker,  breath sounds clear to auscultation  Pulmonary exam normal       Cardiovascular negative cardio ROS  Rhythm:Regular Rate:Normal     Neuro/Psych Anxiety  Neuromuscular disease negative neurological ROS     GI/Hepatic Neg liver ROS, hiatal hernia, GERD-  Medicated,  Endo/Other  negative endocrine ROS  Renal/GU negative Renal ROS  negative genitourinary   Musculoskeletal negative musculoskeletal ROS (+)   Abdominal   Peds  Hematology negative hematology ROS (+)   Anesthesia Other Findings   Reproductive/Obstetrics                          Anesthesia Physical Anesthesia Plan  ASA: I  Anesthesia Plan: General   Post-op Pain Management:    Induction: Intravenous  Airway Management Planned: Oral ETT  Additional Equipment:   Intra-op Plan:   Post-operative Plan: Extubation in OR  Informed Consent: I have reviewed the patients History and Physical, chart, labs and discussed the procedure including the risks, benefits and alternatives for the proposed anesthesia with the patient or authorized representative who has indicated his/her understanding and acceptance.   Dental advisory given  Plan Discussed with: CRNA  Anesthesia Plan Comments:         Anesthesia Quick Evaluation

## 2012-09-17 NOTE — H&P (Signed)
TOTAL HIP ADMISSION H&P  Patient is admitted for right total hip arthroplasty.  Subjective:  Chief Complaint: right hip pain  HPI: [name], 67 y.o. female, has a history of pain and functional disability in the right hip(s) due to arthritis and patient has failed non-surgical conservative treatments for greater than 12 weeks to include NSAID's and/or analgesics, flexibility and strengthening excercises, use of assistive devices, weight reduction as appropriate and activity modification.  Onset of symptoms was gradual starting 5 years ago with gradually worsening course since that time.The patient noted no past surgery on the right hip(s).  Patient currently rates pain in the right hip at 9 out of 10 with activity. Patient has night pain, worsening of pain with activity and weight bearing, trendelenberg gait, pain that interfers with activities of daily living, pain with passive range of motion and crepitus. Patient has evidence of subchondral cysts, subchondral sclerosis, periarticular osteophytes and joint space narrowing by imaging studies. This condition presents safety issues increasing the risk of falls.  There is no current active infection.  Patient Active Problem List   Diagnosis Date Noted  . Degenerative arthritis of hip 09/17/2012  . CONSTIPATION 05/30/2009  . FLATULENCE-GAS-BLOATING 05/30/2009  . INTERNAL HEMORRHOIDS 05/24/2009  . GERD 05/24/2009  . HIATAL HERNIA 05/24/2009  . IRRITABLE BOWEL SYNDROME 05/24/2009  . OSTEOARTHRITIS 05/24/2009   Past Medical History  Diagnosis Date  . Arthritis   . Osteopenia   . GERD (gastroesophageal reflux disease)   . H/O hiatal hernia   . Anxiety     Past Surgical History  Procedure Laterality Date  . Laparoscopy      MANY YRS AGO  . Abdominal hysterectomy  2001    No prescriptions prior to admission   Allergies  Allergen Reactions  . Codeine Nausea Only    REACTION: gi upset    History  Substance Use Topics  . Smoking  status: Former Smoker    Quit date: 06/12/2012  . Smokeless tobacco: Not on file  . Alcohol Use: Yes     Comment: SOCIAL    No family history on file.   Review of Systems  Musculoskeletal: Positive for joint pain.  All other systems reviewed and are negative.    Objective:  Physical Exam  Constitutional: She appears well-developed and well-nourished.  HENT:  Head: Normocephalic and atraumatic.  Eyes: EOM are normal. Pupils are equal, round, and reactive to light.  Neck: Normal range of motion. Neck supple.  Cardiovascular: Normal rate and regular rhythm.   Respiratory: Effort normal and breath sounds normal.  GI: Soft. Bowel sounds are normal.  Musculoskeletal:       Right hip: She exhibits decreased range of motion, decreased strength, bony tenderness and crepitus.  Skin: Skin is warm and dry.  Psychiatric: She has a normal mood and affect.    Vital signs in last 24 hours:    Labs:   Estimated body mass index is 23.68 kg/(m^2) as calculated from the following:   Height as of 09/04/09: 5\' 4"  (1.626 m).   Weight as of 09/04/09: 62.596 kg (138 lb).   Imaging Review Plain radiographs demonstrate severe degenerative joint disease of the right hip(s). The bone quality appears to be good for age and reported activity level.  Assessment/Plan:  End stage arthritis, right hip(s)  The patient history, physical examination, clinical judgement of the provider and imaging studies are consistent with end stage degenerative joint disease of the right hip(s) and total hip arthroplasty is deemed  medically necessary. The treatment options including medical management, injection therapy, arthroscopy and arthroplasty were discussed at length. The risks and benefits of total hip arthroplasty were presented and reviewed. The risks due to aseptic loosening, infection, stiffness, dislocation/subluxation,  thromboembolic complications and other imponderables were discussed.  The patient  acknowledged the explanation, agreed to proceed with the plan and consent was signed. Patient is being admitted for inpatient treatment for surgery, pain control, PT, OT, prophylactic antibiotics, VTE prophylaxis, progressive ambulation and ADL's and discharge planning.The patient is planning to be discharged home with home health services

## 2012-09-17 NOTE — Transfer of Care (Signed)
Immediate Anesthesia Transfer of Care Note  Patient: [name]  Procedure(s) Performed: Procedure(s) (LRB): RIGHT TOTAL HIP ARTHROPLASTY ANTERIOR APPROACH (Right)  Patient Location: PACU  Anesthesia Type: General  Level of Consciousness: sedated, patient cooperative and responds to stimulaton  Airway & Oxygen Therapy: Patient Spontanous Breathing and Patient connected to face mask oxgen  Post-op Assessment: Report given to PACU RN and Post -op Vital signs reviewed and stable  Post vital signs: Reviewed and stable  Complications: No apparent anesthesia complications

## 2012-09-17 NOTE — Brief Op Note (Signed)
09/17/2012  5:33 PM  PATIENT:  Cherlynn Perches  67 y.o. female  PRE-OPERATIVE DIAGNOSIS:  Right hip osteoarthritis  POST-OPERATIVE DIAGNOSIS:  Right hip osteoarthritis  PROCEDURE:  Procedure(s): RIGHT TOTAL HIP ARTHROPLASTY ANTERIOR APPROACH (Right)  SURGEON:  Surgeon(s) and Role:    * Kathryne Hitch, MD - Primary  PHYSICIAN ASSISTANT: Rexene Edison, PA-C  ANESTHESIA:   general  EBL:  Total I/O In: 2000 [I.V.:2000] Out: 800 [Urine:250; Blood:550]  BLOOD ADMINISTERED:none  DRAINS: none   LOCAL MEDICATIONS USED:  NONE  SPECIMEN:  No Specimen  DISPOSITION OF SPECIMEN:  N/A  COUNTS:  YES  TOURNIQUET:  * No tourniquets in log *  DICTATION: .Other Dictation: Dictation Number [id number]  PLAN OF CARE: Admit to inpatient   PATIENT DISPOSITION:  PACU - hemodynamically stable.   Delay start of Pharmacological VTE agent (>24hrs) due to surgical blood loss or risk of bleeding: no

## 2012-09-17 NOTE — Anesthesia Postprocedure Evaluation (Signed)
Anesthesia Post Note  Patient: [name]  Procedure(s) Performed: Procedure(s) (LRB): RIGHT TOTAL HIP ARTHROPLASTY ANTERIOR APPROACH (Right)  Anesthesia type: General  Patient location: PACU  Post pain: Pain level controlled  Post assessment: Post-op Vital signs reviewed  Last Vitals:  Filed Vitals:   09/17/12 1815  BP: 117/58  Pulse: 74  Temp:   Resp: 13    Post vital signs: Reviewed  Level of consciousness: sedated  Complications: No apparent anesthesia complications

## 2012-09-17 NOTE — Preoperative (Signed)
Beta Blockers   Reason not to administer Beta Blockers:Not Applicable 

## 2012-09-18 LAB — BASIC METABOLIC PANEL
BUN: 12 mg/dL (ref 6–23)
Calcium: 9 mg/dL (ref 8.4–10.5)
Chloride: 105 mEq/L (ref 96–112)
Creatinine, Ser: 0.45 mg/dL — ABNORMAL LOW (ref 0.50–1.10)
GFR calc Af Amer: >90 mL/min (ref >90)
GFR calc non Af Amer: >90 mL/min (ref >90)

## 2012-09-18 LAB — CBC
HCT: 29.9 % — ABNORMAL LOW (ref 36.0–46.0)
MCH: 32.2 pg (ref 26.0–34.0)
MCHC: 34.8 g/dL (ref 30.0–36.0)
MCV: 92.6 fL (ref 78.0–100.0)
RDW: 12.4 % (ref 11.5–15.5)

## 2012-09-18 NOTE — Evaluation (Signed)
Occupational Therapy Evaluation Patient Details Name: [name] MRN: [id number] DOB: February 01, 1946 Today's Date: 09/18/2012 Time: 1191-4782 OT Time Calculation (min): 34 min  OT Assessment / Plan / Recommendation Clinical Impression  Pt is recovering from R direct anterior hip replacement.  Pt is moving well POD 1.  All education completed.  Pt is declining a 3 in 1, will have son purchase a toilet riser and shower seat if needed.  No further OT needs.    OT Assessment  Patient does not need any further OT services    Follow Up Recommendations  No OT follow up    Barriers to Discharge      Equipment Recommendations       Recommendations for Other Services    Frequency       Precautions / Restrictions Precautions Precautions: Fall   Pertinent Vitals/Pain R hip with movement, ice applied, RN notified    ADL  Eating/Feeding: Independent Where Assessed - Eating/Feeding: Bed level Grooming: Wash/dry hands;Supervision/safety Where Assessed - Grooming: Unsupported standing Upper Body Bathing: Set up Where Assessed - Upper Body Bathing: Unsupported sitting Lower Body Bathing: Minimal assistance Where Assessed - Lower Body Bathing: Unsupported sitting;Supported sit to stand Upper Body Dressing: Set up Where Assessed - Upper Body Dressing: Unsupported sitting Lower Body Dressing: Minimal assistance Where Assessed - Lower Body Dressing: Unsupported sitting;Supported sit to stand Toilet Transfer: Minimal assistance Toilet Transfer Method: Sit to stand Toilet Transfer Equipment: Regular height toilet;Grab bars Toileting - Clothing Manipulation and Hygiene: Minimal assistance Where Assessed - Glass blower/designer Manipulation and Hygiene: Sit to stand from 3-in-1 or toilet Equipment Used: Rolling walker;Long-handled shoe horn;Long-handled sponge;Reacher;Sock aid Transfers/Ambulation Related to ADLs: min guard assist with RW, verbal cues for hand placement and to extend R LE for sit  to stand. ADL Comments: Educated pt and daughter in tub transfer, alternatives to purchasing tub seat, toilet riser vs. 3 in 1 over toilet and in use of AE for LB ADL.    OT Diagnosis:    OT Problem List:   OT Treatment Interventions:     OT Goals    Visit Information  Last OT Received On: 09/18/12 Assistance Needed: +1    Subjective Data  Subjective: "I will be ok with sponge bathing for a while." Patient Stated Goal: Home with daughter.   Prior Functioning     Home Living Lives With: Spouse;Daughter Available Help at Discharge: Family;Available 24 hours/day Type of Home: House Home Access: Stairs to enter [employer] of Steps: 4 Entrance Stairs-Rails: Right;Left Home Layout: One level Bathroom Shower/Tub: Engineer, manufacturing systems: Standard Home Adaptive Equipment: None Prior Function Level of Independence: Independent Able to Take Stairs?: Yes Driving: Yes Communication Communication: No difficulties Dominant Hand: Right         Vision/Perception     Cognition  Cognition Overall Cognitive Status: Appears within functional limits for tasks assessed/performed Arousal/Alertness: Awake/alert Orientation Level: Appears intact for tasks assessed Behavior During Session: [facility] for tasks performed    Extremity/Trunk Assessment Right Upper Extremity Assessment RUE ROM/Strength/Tone: [facility] for tasks assessed Left Upper Extremity Assessment LUE ROM/Strength/Tone: WFL for tasks assessed Right Lower Extremity Assessment RLE ROM/Strength/Tone: Deficits RLE ROM/Strength/Tone Deficits: pt able to advance LE during gait, able to flex hip whil in supine. RLE Sensation: WFL - Light Touch Left Lower Extremity Assessment LLE ROM/Strength/Tone: Within functional levels LLE Sensation: WFL - Light Touch Trunk Assessment Trunk Assessment: Normal     Mobility Bed Mobility Bed Mobility: Supine to Sit;Sit to Supine;Sitting -  Scoot to [place] of Bed Supine to  Sit: 4: Min guard;HOB flat;HOB elevated Sitting - Scoot to [place] of Bed: 5: Supervision Sit to Supine: 4: Min guard;HOB elevated Details for Bed Mobility Assistance: pt able to move to sitting using sheet to slide RLE. Transfers Transfers: Sit to Stand;Stand to Sit Sit to Stand: 4: Min guard;With upper extremity assist;From bed;From toilet Stand to Sit: 4: Min guard;To bed;To toilet Details for Transfer Assistance: cues for hand placement and for RLE position to sit down     Exercise Total Joint Exercises Heel Slides: AAROM;Right;10 reps;Supine   Balance Balance Balance Assessed: Yes Static Standing Balance Static Standing - Balance Support: No upper extremity supported Static Standing - Level of Assistance: 5: Stand by assistance Static Standing - Comment/# of Minutes: 1   End of Session OT - End of Session Activity Tolerance: Patient tolerated treatment well Patient left: in bed;with call bell/phone within reach;with family/visitor present Nurse Communication: Patient requests pain meds (pt urinated)  GO     Evern Bio 09/18/2012, 1:47 PM [phone]

## 2012-09-18 NOTE — Progress Notes (Signed)
Subjective: 1 Day Post-Op Procedure(s) (LRB): RIGHT TOTAL HIP ARTHROPLASTY ANTERIOR APPROACH (Right) Patient reports pain as moderate.    Objective: Vital signs in last 24 hours: Temp:  [97.5 F (36.4 C)-98.7 F (37.1 C)] 98.2 F (36.8 C) (04/05 0924) Pulse Rate:  [66-102] 85 (04/05 0924) Resp:  [12-18] 16 (04/05 0924) BP: (96-127)/(57-82) 102/68 mmHg (04/05 0924) SpO2:  [98 %-100 %] 99 % (04/05 0924) FiO2 (%):  [100 %] 100 % (04/04 1900) Weight:  [56.7 kg (125 lb)] 56.7 kg (125 lb) (04/04 1900)  Intake/Output from previous day: 04/04 0701 - 04/05 0700 In: 3908.8 [P.O.:60; I.V.:3848.8] Out: 2650 [Urine:2100; Blood:550] Intake/Output this shift: Total I/O In: 240 [P.O.:240] Out: -    Recent Labs  09/18/12 0433  HGB 10.4*    Recent Labs  09/18/12 0433  WBC 8.6  RBC 3.23*  HCT 29.9*  PLT 158    Recent Labs  09/18/12 0433  NA 140  K 5.0  CL 105  CO2 29  BUN 12  CREATININE 0.45*  GLUCOSE 160*  CALCIUM 9.0   No results found for this basename: LABPT, INR,  in the last 72 hours  Sensation intact distally Intact pulses distally Dorsiflexion/Plantar flexion intact Incision: dressing C/D/I  Assessment/Plan: 1 Day Post-Op Procedure(s) (LRB): RIGHT TOTAL HIP ARTHROPLASTY ANTERIOR APPROACH (Right) Up with therapy  Kindle Strohmeier Y 09/18/2012, 9:49 AM

## 2012-09-18 NOTE — Care Management (Signed)
CM spoke with patient with adult daughter and family present at bedside. Pt offered choice for [facility] due to patient discharging to daughter's residence upon discharge.Pt informed will require choice of HH agency prior to discharge. Pt states will borrow RW from friend.  Roxy Manns Riyad Keena,RN,BSN [phone]

## 2012-09-18 NOTE — Op Note (Signed)
[name], [name]                ACCOUNT NO.:  [id number]  MEDICAL RECORD NO.:  [id number]  LOCATION:  1615                         FACILITY:  [facility]  PHYSICIAN:  Vanita Panda. Magnus Ivan, M.D.DATE OF BIRTH:  09/06/1945  DATE OF PROCEDURE:  09/17/2012 DATE OF DISCHARGE:                              OPERATIVE REPORT   PREOPERATIVE DIAGNOSIS:  Severe end-stage arthritis and degenerative joint disease, right hip.  POSTOPERATIVE DIAGNOSIS:  Severe end-stage arthritis and degenerative joint disease, right hip.  PROCEDURE:  Right total hip arthroplasty through direct anterior approach.  IMPLANTS:  DePuy Sector Gription acetabular component size 48, size 32+ 4 neutral polyethylene liner, size 11 Corail femoral component with standard offset, size 32+ 1 ceramic hip ball.  SURGEON:  Doneen Poisson, MD.  ASSISTANT:  Richardean Canal, P.A.C.  ANESTHESIA:  General.  ANTIBIOTICS:  2 g IV Ancef.  BLOOD LOSS:  550 mL.  COMPLICATIONS:  None.  INDICATIONS:  [name] is a very pleasant 67 year old active female with severe debilitating arthritis involving her right hip.  She has failed numerous evidence of conservative treatment including injections, anti-inflammatories/NSAIDs, well walking with assisted devices, and activity modification.  She has gotten where x-rays show that she had complete loss of joint space on the right side.  She has periarticular osteophytes, subchondral sclerosis, and subchondral cysts.  With failure of conservative treatment, her daily pain and poor mobility as well as her decrease in quality of life, she likely wishes to proceed with a total hip arthroplasty on the right side.  The risks and benefits of surgery were explained to her in detail.  She does wish to proceed.  PROCEDURE DESCRIPTION:  After informed consent was obtained, appropriate right hip was marked.  She was brought to the operating room.  General anesthesia was obtained while she was  on the stretcher.  Foley catheter was placed.  In both feet, traction boots applied to them.  She was then placed supine on the Hana fracture table.  Both feet were placed in in- line skeletal traction with no traction applied and a perineal post was in place.  Her right hip was then prepped and draped with DuraPrep and sterile drapes.  Time-out was called to identify the correct patient, correct right hip.  We then made an incision just inferior and posterior to the anterior superior iliac spine and carried this obliquely down the leg.  I then dissected down to the tensor fascia lata and the tensor fascia was divided longitudinally.  We then proceeded with a direct anterior approach to the hip.  A Cobra retractor was placed around the lateral neck and then up underneath the rectus femoris, a Cobra retractor was placed around the medial neck.  I cauterized the lateral femoral circumflex vessels and then I opened up the hip capsule in a L- type format and placed Cobra retractors within the hip capsule.  I then made my femoral neck cut with an oscillating saw just proximal to the lesser trochanter and finished this off with an osteotome.  I then placed a corkscrew guide in the femoral head and removed the femoral head in its entirety.  I then cleaned the acetabulum of debris  including remnants of the labrum.  I placed a Hohmann medially and a Cobra retractor laterally.  I then began reaming from a size 42 reamer and 2 mm increments up to a size 48 with all reamers placed under direct visualization.  The last reamer also placed under direct fluoroscopy, so we could obtain our depth of reaming, our inclination, and anteversion. Once I was pleased with this positioning, we placed a real DePuy Sector Gription acetabular component size 48 with apex hole eliminator guide and the real 32+ 4 neutral polyethylene liner.  Attention was then turned to the femur.  With the leg externally rotated to 90  degrees, extended and adducted.  We brought the leg back over down and placed a Mueller retractor medially and a Hohmann retractor behind the greater trochanter.  I released the lateral joint capsule and then used a rongeur and a box cutting guide to open up the femoral canal and to lateralize.  I then began broaching with a size 8 broach up to a size 11.  I used a calcar planer to plane off the size 11 broach, and then trialed a standard neck and a 32+ 1 hip ball.  We brought the leg back over and up the traction, internal rotation reduced this in acetabulum and it was stable.  Her leg lengths were measured to be near equal.  We then dislocated the hip and removed the trial components.  I placed the real size 11 Corail femoral component with standard offset and the real 32+ 1 ceramic hip ball.  We reduced this back into the acetabulum stable.  We then used pulsatile lavage to completely lavage the joint with a liter of normal saline solution.  We closed the joint capsule with interrupted #1 Ethibond suture followed by running #1 Vicryl in the tensor fascia, 0 Vicryl in deep tissue, 2-0 Vicryl in subcutaneous tissue, 4-0 Monocryl subcuticular suture and then Dermabond on the skin. A well-padded dressing was applied.  She was taken off of the Hana table, awakened, extubated, and taken to recovery room in stable condition.  All final counts were correct and no complications noted.     Vanita Panda. Magnus Ivan, M.D.     CYB/MEDQ  D:  09/17/2012  T:  09/18/2012  Job:  [id number]

## 2012-09-18 NOTE — Evaluation (Signed)
Physical Therapy Evaluation Patient Details Name: [name] MRN: [id number] DOB: [birth year] Today's Date: 09/18/2012 Time: 1034-1100 PT Time Calculation (min): 26 min  PT Assessment / Plan / Recommendation Clinical Impression  Pt. is 67 yo female admitted 09/17/12 for R direct anterior THA. Pt. tolerated ambulation in hall, able to advance RLE without difficulty. Pt. plans to DC to home of daughter's. Daughter will see if a RW can be borrowed. continue PT to improve function, strength and ROM.    PT Assessment  Patient needs continued PT services    Follow Up Recommendations  Home health PT    Does the patient have the potential to tolerate intense rehabilitation      Barriers to Discharge        Equipment Recommendations  Rolling walker with 5" wheels    Recommendations for Other Services     Frequency 7X/week    Precautions / Restrictions Precautions Precautions: None   Pertinent Vitals/Pain Only states soreness.     Mobility  Bed Mobility Bed Mobility: Supine to Sit Supine to Sit: 4: Min guard;With rails;HOB elevated Details for Bed Mobility Assistance: pt able to move to sitting using sheet to slide RLE. Transfers Transfers: Sit to Stand;Stand to Sit Sit to Stand: From bed;4: Min guard Stand to Sit: 4: Min guard;To chair/3-in-1 Details for Transfer Assistance: cues for hand placement and for RLE position to sit down Ambulation/Gait Ambulation/Gait Assistance: 4: Min assist Ambulation Distance (Feet): 150 Feet Assistive device: Rolling walker Ambulation/Gait Assistance Details: cues for sequence and posture. Gait Pattern: Step-through pattern    Exercises Total Joint Exercises Heel Slides: AAROM;Right;10 reps;Supine   PT Diagnosis: Difficulty walking  PT Problem List: Decreased strength;Decreased range of motion;Decreased activity tolerance;Decreased mobility;Decreased knowledge of use of DME PT Treatment Interventions: DME instruction;Gait training;Stair  training;Functional mobility training;Therapeutic activities;Therapeutic exercise;Patient/family education   PT Goals Acute Rehab PT Goals PT Goal Formulation: With patient/family Time For Goal Achievement: 09/25/12 Potential to Achieve Goals: Good Pt will go Supine/Side to Sit: with supervision PT Goal: Supine/Side to Sit - Progress: Goal set today Pt will go Sit to Supine/Side: with supervision PT Goal: Sit to Supine/Side - Progress: Goal set today Pt will go Sit to Stand: with supervision PT Goal: Sit to Stand - Progress: Goal set today Pt will go Stand to Sit: with supervision PT Goal: Stand to Sit - Progress: Goal set today Pt will Ambulate: >150 feet;with supervision;with rolling walker PT Goal: Ambulate - Progress: Goal set today Pt will Go Up / Down Stairs: 3-5 stairs;with min assist;with least restrictive assistive device PT Goal: Up/Down Stairs - Progress: Goal set today Pt will Perform Home Exercise Program: with supervision, verbal cues required/provided PT Goal: Perform Home Exercise Program - Progress: Goal set today  Visit Information  Last PT Received On: 09/18/12 Assistance Needed: +1    Subjective Data  Subjective: IMy hip does hurt like it did last night Patient Stated Goal: To walk without pain.   Prior Functioning  Home Living Lives With: Spouse;Daughter Available Help at Discharge: Family Type of Home: House Home Access: Stairs to enter Secretary/administrator of Steps: 4 Entrance Stairs-Rails: Right;Left Home Layout: One level Bathroom Shower/Tub:  Firefighter: Standard Home Adaptive Equipment: None, daughter to try to borrow. RW Prior Function Level of Independence: Independent Able to Take Stairs?: Yes    Cognition  Cognition Overall Cognitive Status: Appears within functional limits for tasks assessed/performed Arousal/Alertness: Awake/alert Orientation Level: Appears intact for tasks assessed Behavior During Session: [facility] for  tasks  performed    Extremity/Trunk Assessment Right Lower Extremity Assessment RLE ROM/Strength/Tone: Deficits RLE ROM/Strength/Tone Deficits: pt able to advance LE during gait, able to flex hip while in supine. RLE Sensation: WFL - Light Touch Left Lower Extremity Assessment LLE ROM/Strength/Tone: Within functional levels LLE Sensation: WFL - Light Touch   Balance    End of Session PT - End of Session Activity Tolerance: Patient tolerated treatment well Patient left: in chair;with call bell/phone within reach;with family/visitor present Nurse Communication: Mobility status;Patient requests pain meds  GP     Rada Hay 09/18/2012, 12:18 PM  Blanchard Kelch PT [phone]

## 2012-09-18 NOTE — Progress Notes (Signed)
Physical Therapy Treatment Patient Details Name: [name] MRN: [id number] DOB: 07/27/1945 Today's Date: 09/18/2012 Time: 4098-1191 PT Time Calculation (min): 26 min  PT Assessment / Plan / Recommendation Comments on Treatment Session       Follow Up Recommendations  Home health PT     Does the patient have the potential to tolerate intense rehabilitation     Barriers to Discharge        Equipment Recommendations  Rolling walker with 5" wheels    Recommendations for Other Services OT consult  Frequency 7X/week   Plan Discharge plan remains appropriate    Precautions / Restrictions Precautions Precautions: Fall Restrictions Weight Bearing Restrictions: No   Pertinent Vitals/Pain     Mobility  Bed Mobility Bed Mobility: Supine to Sit;Sit to Supine;Sitting - Scoot to Edge of Bed Supine to Sit: 4: Min guard Sitting - Scoot to [place] of Bed: 5: Supervision Sit to Supine: 4: Min guard Details for Bed Mobility Assistance: cues for use of L LE to self assist Transfers Transfers: Sit to Stand;Stand to Sit Sit to Stand: 4: Min guard;With upper extremity assist;From bed Stand to Sit: 4: Min guard;To bed;With upper extremity assist Details for Transfer Assistance: cues for hand placement and for RLE position to sit down Ambulation/Gait Ambulation/Gait Assistance: 4: Min assist;4: Min guard Ambulation Distance (Feet): 150 Feet (twice) Assistive device: Rolling walker Ambulation/Gait Assistance Details: cues for posture, initial sequence and position from RW Gait Pattern: Step-through pattern    Exercises Total Joint Exercises Ankle Circles/Pumps: AROM;15 reps;Supine;Both Quad Sets: AROM;Both;10 reps;Supine Heel Slides: AAROM;Right;Supine;15 reps Hip ABduction/ADduction: AAROM;15 reps;Supine;Right   PT Diagnosis:    PT Problem List:   PT Treatment Interventions:     PT Goals Acute Rehab PT Goals PT Goal Formulation: With patient/family Time For Goal Achievement:  09/25/12 Potential to Achieve Goals: Good Pt will go Supine/Side to Sit: with supervision PT Goal: Supine/Side to Sit - Progress: Progressing toward goal Pt will go Sit to Supine/Side: with supervision PT Goal: Sit to Supine/Side - Progress: Progressing toward goal Pt will go Sit to Stand: with supervision PT Goal: Sit to Stand - Progress: Progressing toward goal Pt will go Stand to Sit: with supervision PT Goal: Stand to Sit - Progress: Progressing toward goal Pt will Ambulate: >150 feet;with supervision;with rolling walker PT Goal: Ambulate - Progress: Progressing toward goal Pt will Go Up / Down Stairs: 3-5 stairs;with min assist;with least restrictive assistive device PT Goal: Up/Down Stairs - Progress: Goal set today Pt will Perform Home Exercise Program: with supervision, verbal cues required/provided PT Goal: Perform Home Exercise Program - Progress: Progressing toward goal  Visit Information  Last PT Received On: 09/18/12 Assistance Needed: +1    Subjective Data  Subjective: I'm doing well, it hurts but not like before surgery Patient Stated Goal: To walk without pain.   Cognition  Cognition Overall Cognitive Status: Appears within functional limits for tasks assessed/performed Arousal/Alertness: Awake/alert Orientation Level: Appears intact for tasks assessed Behavior During Session: [facility] for tasks performed    Balance  Balance Balance Assessed: Yes Static Standing Balance Static Standing - Balance Support: No upper extremity supported Static Standing - Level of Assistance: 5: Stand by assistance Static Standing - Comment/# of Minutes: 1  End of Session PT - End of Session Equipment Utilized During Treatment: Gait belt Activity Tolerance: Patient tolerated treatment well Patient left: in bed;with call bell/phone within reach;with family/visitor present Nurse Communication: Mobility status;Patient requests pain meds   GP  Josehua Hammar 09/18/2012, 4:40  PM

## 2012-09-19 LAB — CBC
HCT: 25.4 % — ABNORMAL LOW (ref 36.0–46.0)
MCHC: 33.9 g/dL (ref 30.0–36.0)
RDW: 13.1 % (ref 11.5–15.5)

## 2012-09-19 MED ORDER — POLYETHYLENE GLYCOL 3350 17 G PO PACK
17.0000 g | PACK | Freq: Two times a day (BID) | ORAL | Status: DC
  Administered 2012-09-19 – 2012-09-20 (×2): 17 g via ORAL

## 2012-09-19 NOTE — Progress Notes (Signed)
Physical Therapy Treatment Patient Details Name: [name] MRN: [id number] DOB: [birth year] Today's Date: 09/19/2012 Time: 4098-1191 PT Time Calculation (min): 32 min  PT Assessment / Plan / Recommendation Comments on Treatment Session  Progressing well, should be ready for d/c in am    Follow Up Recommendations  Home health PT     Does the patient have the potential to tolerate intense rehabilitation     Barriers to Discharge        Equipment Recommendations  Rolling walker with 5" wheels    Recommendations for Other Services OT consult  Frequency 7X/week   Plan Discharge plan remains appropriate    Precautions / Restrictions Precautions Precautions: Fall Restrictions Weight Bearing Restrictions: No   Pertinent Vitals/Pain 2/10; premed, ice packs provided    Mobility  Bed Mobility Bed Mobility: Supine to Sit Supine to Sit: 4: Min guard Sitting - Scoot to Edge of Bed: 5: Supervision Details for Bed Mobility Assistance: cues for use of L LE to self assist Transfers Transfers: Sit to Stand;Stand to Sit Sit to Stand: 4: Min guard;With upper extremity assist;From bed Stand to Sit: 4: Min guard;With upper extremity assist;To chair/3-in-1;With armrests Details for Transfer Assistance: cues for hand placement and for RLE position to sit down Ambulation/Gait Ambulation/Gait Assistance: 4: Min assist Ambulation Distance (Feet): 175 Feet (Twice) Assistive device: Rolling walker Ambulation/Gait Assistance Details: min cues for posture and position from RW Gait Pattern: Step-through pattern Stairs: Yes Stairs Assistance: 4: Min assist Stairs Assistance Details (indicate cue type and reason): cues for sequence and foot/crutch placement Stair Management Technique: One rail Right;Step to pattern;With crutches;Forwards Number of Stairs: 8 (4 with crutch and rail; 4 with HHA and rail)    Exercises Total Joint Exercises Ankle Circles/Pumps: AROM;Supine;Both;20 reps Quad  Sets: AROM;Both;10 reps;Supine Gluteal Sets: AROM;10 reps;Supine;Both Heel Slides: AAROM;Right;Supine;20 reps Hip ABduction/ADduction: AAROM;Supine;Right;20 reps   PT Diagnosis:    PT Problem List:   PT Treatment Interventions:     PT Goals Acute Rehab PT Goals PT Goal Formulation: With patient/family Time For Goal Achievement: 09/25/12 Potential to Achieve Goals: Good Pt will go Supine/Side to Sit: with supervision PT Goal: Supine/Side to Sit - Progress: Progressing toward goal Pt will go Sit to Supine/Side: with supervision PT Goal: Sit to Supine/Side - Progress: Progressing toward goal Pt will go Sit to Stand: with supervision PT Goal: Sit to Stand - Progress: Progressing toward goal Pt will go Stand to Sit: with supervision PT Goal: Stand to Sit - Progress: Progressing toward goal Pt will Ambulate: >150 feet;with supervision;with rolling walker PT Goal: Ambulate - Progress: Progressing toward goal Pt will Go Up / Down Stairs: 3-5 stairs;with min assist;with least restrictive assistive device PT Goal: Up/Down Stairs - Progress: Met Pt will Perform Home Exercise Program: with supervision, verbal cues required/provided PT Goal: Perform Home Exercise Program - Progress: Progressing toward goal  Visit Information  Last PT Received On: 09/19/12 Assistance Needed: +1    Subjective Data  Subjective: I hurt a lot last night but I'm doing much better now Patient Stated Goal: To walk without pain.   Cognition  Cognition Overall Cognitive Status: Appears within functional limits for tasks assessed/performed Arousal/Alertness: Awake/alert Orientation Level: Appears intact for tasks assessed Behavior During Session: [facility] for tasks performed    Balance     End of Session PT - End of Session Equipment Utilized During Treatment: Gait belt Activity Tolerance: Patient tolerated treatment well Patient left: in chair;with call bell/phone within reach;with family/visitor present Nurse  Communication: Mobility status   GP     [name] 09/19/2012, 12:41 PM

## 2012-09-19 NOTE — Care Management Note (Signed)
Spoke with patient and family at bedside to discuss home health choice. [facility] was chosen for home health services. Appears PT is recommended and patient will need both HHC PT orders as well as 3 IN 1 commode and rolling walker ordered. Confirmed that this DME will be covered by [facility] reps since patient has not had them in the past.  Family would like to have 3 in 1 and RW ordered through [facility]. Made DME rep aware of referral for DME and called intake with [facility] to make aware of referral for [facility] PT. Spoke with Wynona Canes at [facility] to make aware that patient will go to her daughter's home for recovery post discharge at the address of [address] [place], [place] , [place] [address] and cell number [phone]- Glendale Chard, daughter.  Verbal confirmation received that [facility] can accept patient. [place], [place] [phone]

## 2012-09-19 NOTE — Progress Notes (Signed)
T 102.7; pt encouraged to use Incentive Spirometer which had no effect on T. Called MD who encouraged I/S along with prn Tylenol. Will continue to follow.

## 2012-09-19 NOTE — Progress Notes (Signed)
Subjective: Pt stable - doesn't feel as good today as yesterday   Objective: Vital signs in last 24 hours: Temp:  [98 F (36.7 C)-99.6 F (37.6 C)] 98.6 F (37 C) (04/06 1232) Pulse Rate:  [85-113] 88 (04/06 1232) Resp:  [16-18] 18 (04/06 1232) BP: (92-107)/(54-69) 94/54 mmHg (04/06 1232) SpO2:  [96 %-99 %] 98 % (04/06 1232)  Intake/Output from previous day: 04/05 0701 - 04/06 0700 In: 1407.6 [P.O.:480; I.V.:927.6] Out: 1320 [Urine:1320] Intake/Output this shift: Total I/O In: 480 [P.O.:480] Out: 350 [Urine:350]  Exam:  Neurovascular intact Sensation intact distally Intact pulses distally  Labs:  Recent Labs  09/18/12 0433 09/19/12 0447  HGB 10.4* 8.5*    Recent Labs  09/18/12 0433 09/19/12 0447  WBC 8.6 8.4  RBC 3.23* 2.68*  HCT 29.9* 25.4*  PLT 158 138*    Recent Labs  09/18/12 0433  NA 140  K 5.0  CL 105  CO2 29  BUN 12  CREATININE 0.45*  GLUCOSE 160*  CALCIUM 9.0   No results found for this basename: LABPT, INR,  in the last 72 hours  Assessment/Plan: hgb lower - mobilize with PT - probable dc am   DEAN,GREGORY SCOTT 09/19/2012, 2:06 PM

## 2012-09-20 LAB — CBC
MCH: 31.7 pg (ref 26.0–34.0)
Platelets: 141 10*3/uL — ABNORMAL LOW (ref 150–400)
RBC: 2.71 MIL/uL — ABNORMAL LOW (ref 3.87–5.11)

## 2012-09-20 MED ORDER — METHOCARBAMOL 500 MG PO TABS: 500.0000 mg | ORAL_TABLET | Freq: Four times a day (QID) | ORAL | Status: DC | PRN

## 2012-09-20 MED ORDER — DSS 100 MG PO CAPS: 100.0000 mg | ORAL_CAPSULE | Freq: Two times a day (BID) | ORAL | Status: DC

## 2012-09-20 MED ORDER — FERROUS SULFATE 325 (65 FE) MG PO TABS: 325.0000 mg | ORAL_TABLET | Freq: Three times a day (TID) | ORAL | Status: DC

## 2012-09-20 MED ORDER — OXYCODONE-ACETAMINOPHEN 5-325 MG PO TABS: 1.0000 | ORAL_TABLET | ORAL | Status: DC | PRN

## 2012-09-20 MED ORDER — ASPIRIN 325 MG PO TBEC: 325.0000 mg | DELAYED_RELEASE_TABLET | Freq: Two times a day (BID) | ORAL | Status: DC

## 2012-09-20 NOTE — Progress Notes (Signed)
Physical Therapy Treatment Patient Details Name: [name] MRN: [id number] DOB: 1945/11/11 Today's Date: 09/20/2012 Time: 4098-1191 PT Time Calculation (min): 39 min  PT Assessment / Plan / Recommendation Comments on Treatment Session  POD # 3 L THR Dir Ant plans to D/C to home today.  Due to the rain pt stated she will enter the homw through the car port which has 2 staeps and no rails.  Performed stair training with daughter and given handout on HEP.    Follow Up Recommendations  Home health PT     Does the patient have the potential to tolerate intense rehabilitation     Barriers to Discharge        Equipment Recommendations       Recommendations for Other Services    Frequency     Plan Discharge plan remains appropriate    Precautions / Restrictions Precautions Precautions: None Restrictions Weight Bearing Restrictions: Yes RLE Weight Bearing: Weight bearing as tolerated   Pertinent Vitals/Pain C/o 5/10 ICE applied Pain meds requested    Mobility  Bed Mobility Bed Mobility: Not assessed Details for Bed Mobility Assistance: Pt OOB in recliner Transfers Transfers: Sit to Stand;Stand to Sit Sit to Stand: 5: Supervision;From chair/3-in-1 Stand to Sit: 5: Supervision;To chair/3-in-1 Details for Transfer Assistance: good hand placement and safety cognition Ambulation/Gait Ambulation/Gait Assistance: 5: Supervision Ambulation Distance (Feet): 185 Feet Assistive device: Rolling walker Ambulation/Gait Assistance Details: good safety cognition Gait Pattern: Step-through pattern Gait velocity: WFL Stairs: Yes Stairs Assistance: 4: Min assist Stairs Assistance Details (indicate cue type and reason): With daughter with 50% VC's on proper sequencing and safe pt handling.  Handout also given. Stair Management Technique: No rails;Backwards;With walker Number of Stairs: 2     PT Goals                                                        progressing    Visit  Information  Last PT Received On: 09/20/12 Assistance Needed: +1    Subjective Data  Subjective: It's raining so we are going in the car port which has 2 steps and no rail.   Cognition    good   Balance   fair+  End of Session PT - End of Session Equipment Utilized During Treatment: Gait belt Activity Tolerance: Patient tolerated treatment well Patient left: in chair;with call bell/phone within reach;with family/visitor present   Felecia Shelling  PTA [facility]  Acute  Rehab Pager      [phone]

## 2012-09-20 NOTE — Progress Notes (Signed)
Subjective: 3 Days Post-Op Procedure(s) (LRB): RIGHT TOTAL HIP ARTHROPLASTY ANTERIOR APPROACH (Right) Patient reports pain as moderate.    Objective: Vital signs in last 24 hours: Temp:  [98.6 F (37 C)-102.7 F (39.3 C)] 98.6 F (37 C) (04/07 0545) Pulse Rate:  [87-109] 87 (04/07 0545) Resp:  [16-18] 16 (04/07 0545) BP: (94-105)/(54-70) 104/69 mmHg (04/07 0545) SpO2:  [96 %-98 %] 97 % (04/07 0545)  Intake/Output from previous day: 04/06 0701 - 04/07 0700 In: 572.3 [P.O.:480; I.V.:92.3] Out: 350 [Urine:350] Intake/Output this shift:     Recent Labs  09/18/12 0433 09/19/12 0447 09/20/12 0433  HGB 10.4* 8.5* 8.6*    Recent Labs  09/19/12 0447 09/20/12 0433  WBC 8.4 7.5  RBC 2.68* 2.71*  HCT 25.4* 26.0*  PLT 138* 141*    Recent Labs  09/18/12 0433  NA 140  K 5.0  CL 105  CO2 29  BUN 12  CREATININE 0.45*  GLUCOSE 160*  CALCIUM 9.0   No results found for this basename: LABPT, INR,  in the last 72 hours  Intact pulses distally Dorsiflexion/Plantar flexion intact Incision: dressing C/D/I No cellulitis present  Assessment/Plan: 3 Days Post-Op Procedure(s) (LRB): RIGHT TOTAL HIP ARTHROPLASTY ANTERIOR APPROACH (Right) Discharge home with home health  Kathryne Hitch 09/20/2012, 7:51 AM

## 2012-09-20 NOTE — Discharge Summary (Signed)
Patient ID: [name] MRN: [id number] DOB/AGE: 66-Mar-1947 66 y.o.  Admit date: 09/17/2012 Discharge date: 09/20/2012  Admission Diagnoses:  Principal Problem:   Degenerative arthritis of hip   Discharge Diagnoses:  Same  Past Medical History  Diagnosis Date  . Arthritis   . Osteopenia   . GERD (gastroesophageal reflux disease)   . H/O hiatal hernia   . Anxiety     Surgeries: Procedure(s): RIGHT TOTAL HIP ARTHROPLASTY ANTERIOR APPROACH on 09/17/2012   Consultants:    Discharged Condition: Improved  Hospital Course: [name] is an 67 y.o. female who was admitted 09/17/2012 for operative treatment ofDegenerative arthritis of hip. Patient has severe unremitting pain that affects sleep, daily activities, and work/hobbies. After pre-op clearance the patient was taken to the operating room on 09/17/2012 and underwent  Procedure(s): RIGHT TOTAL HIP ARTHROPLASTY ANTERIOR APPROACH.    Patient was given perioperative antibiotics: Anti-infectives   Start     Dose/Rate Route Frequency Ordered Stop   09/17/12 2200  ceFAZolin (ANCEF) IVPB 1 g/50 mL premix     1 g 100 mL/hr over 30 Minutes Intravenous Every 6 hours 09/17/12 1910 09/18/12 0501   09/17/12 0816  ceFAZolin (ANCEF) IVPB 2 g/50 mL premix     2 g 100 mL/hr over 30 Minutes Intravenous On call to O.R. 09/17/12 0816 09/17/12 1606       Patient was given sequential compression devices, early ambulation, and chemoprophylaxis to prevent DVT.  Patient benefited maximally from hospital stay and there were no complications.    Recent vital signs: Patient Vitals for the past 24 hrs:  BP Temp Temp src Pulse Resp SpO2  09/20/12 0545 104/69 mmHg 98.6 F (37 C) Oral 87 16 97 %  09/20/12 0256 - 98.8 F (37.1 C) - - - -  09/19/12 2237 - 100.7 F (38.2 C) Oral - - -  09/19/12 2126 105/70 mmHg 102.7 F (39.3 C) Oral 109 18 97 %  09/19/12 1600 - - - - 18 98 %  09/19/12 1232 94/54 mmHg 98.6 F (37 C) Oral 88 18 98 %  09/19/12  1200 - - - - 18 98 %  09/19/12 0800 - - - - 16 96 %     Recent laboratory studies:  Recent Labs  09/18/12 0433 09/19/12 0447 09/20/12 0433  WBC 8.6 8.4 7.5  HGB 10.4* 8.5* 8.6*  HCT 29.9* 25.4* 26.0*  PLT 158 138* 141*  NA 140  --   --   K 5.0  --   --   CL 105  --   --   CO2 29  --   --   BUN 12  --   --   CREATININE 0.45*  --   --   GLUCOSE 160*  --   --   CALCIUM 9.0  --   --      Discharge Medications:     Medication List    STOP taking these medications       HYDROcodone-acetaminophen 5-325 MG per tablet  Commonly known as:  NORCO/VICODIN      TAKE these medications       ALPRAZolam 0.25 MG tablet  Commonly known as:  XANAX  Take 0.25 mg by mouth daily with lunch.     aspirin 325 MG EC tablet  Take 1 tablet (325 mg total) by mouth 2 (two) times daily.     DSS 100 MG Caps  Take 100 mg by mouth 2 (two) times daily.  ferrous sulfate 325 (65 FE) MG tablet  Take 1 tablet (325 mg total) by mouth 3 (three) times daily after meals.     ibandronate 150 MG tablet  Commonly known as:  BONIVA  Take 150 mg by mouth every 30 (thirty) days. Take in the morning with a full glass of water, on an empty stomach, and do not take anything else by mouth or lie down for the next 30 min.     meloxicam 15 MG tablet  Commonly known as:  MOBIC  Take 15 mg by mouth daily.     methocarbamol 500 MG tablet  Commonly known as:  ROBAXIN  Take 1 tablet (500 mg total) by mouth every 6 (six) hours as needed.     multivitamin with minerals Tabs  Take 1 tablet by mouth daily.     oxyCODONE-acetaminophen 5-325 MG per tablet  Commonly known as:  ROXICET  Take 1-2 tablets by mouth every 4 (four) hours as needed for pain.     pantoprazole 40 MG tablet  Commonly known as:  PROTONIX  Take 40 mg by mouth daily.     polyethylene glycol packet  Commonly known as:  MIRALAX / GLYCOLAX  Take 17 g by mouth daily.     Vitamin D 2000 UNITS tablet  Take 2,000 Units by mouth daily.         Diagnostic Studies: Dg Hip Complete Right  09/17/2012  *RADIOLOGY REPORT*  Clinical Data: Osteoarthritis post right hip replacement  RIGHT HIP - COMPLETE 2+ VIEW  Comparison: Two digital C-arm fluoroscopic images obtained intraoperatively without priors for comparison.  Exam is interpreted postoperatively.  Findings: Acetabular and femoral components of a right hip prosthesis are identified in expected positions. No fracture, dislocation or bone destruction. Bones appear demineralized.  IMPRESSION: Right hip prosthesis without acute abnormalities.   Original Report Authenticated By: Ulyses Southward, M.D.    Dg Pelvis Portable  09/17/2012  *RADIOLOGY REPORT*  Clinical Data: Postop right hip anterior approach.  PORTABLE PELVIS  Comparison: 09/17/2012  Findings: The patient has undergone right hemiarthroplasty.  There is no evidence for new fracture. No evidence for dislocation of hardware based on this portable, frontal view.  Postoperative gas is identified within the soft tissues.  Regional bowel gas pattern is nonobstructive.  IMPRESSION:  1.  Status post right hip hemiarthroplasty.  No adverse features identified. 2.  Expected postoperative changes.   Original Report Authenticated By: Norva Pavlov, M.D.    Dg Hip Portable 1 View Right  09/17/2012  *RADIOLOGY REPORT*  Clinical Data: Postop right hip anterior approach.  PORTABLE RIGHT HIP - 1 VIEW  Comparison: 09/17/2012 portable hip  Findings: Cross-table lateral view is performed, showing a hemiarthroplasty.  Hardware is located.  No evidence for acute fracture.  Postoperative gas is noted in the soft tissues.  IMPRESSION: No evidence for dislocation.   Original Report Authenticated By: Norva Pavlov, M.D.    Dg C-arm 1-60 Min-no Report  09/17/2012  CLINICAL DATA: intra op   C-ARM 1-60 MINUTES  Fluoroscopy was utilized by the requesting physician.  No radiographic  interpretation.      Disposition: to home      Discharge Orders   Future  Orders Complete By Expires     Call MD / Call 911  As directed     Comments:      If you experience chest pain or shortness of breath, CALL 911 and be transported to the hospital emergency room.  If you develope  a fever above 101 F, pus (white drainage) or increased drainage or redness at the wound, or calf pain, call your surgeon's office.    Constipation Prevention  As directed     Comments:      Drink plenty of fluids.  Prune juice may be helpful.  You may use a stool softener, such as Colace (over the counter) 100 mg twice a day.  Use MiraLax (over the counter) for constipation as needed.    Diet - low sodium heart healthy  As directed     Discharge patient  As directed     Discharge wound care:  As directed     Comments:      Keep dressing clean dry and intact until Wednesday then may remove dressing and shower    Increase activity slowly as tolerated  As directed     Weight bearing as tolerated  As directed        Follow-up Information   Follow up with [facility]. (home health services and DME/equipment)    Contact information:   [phone]      Follow up with Kathryne Hitch, MD. Schedule an appointment as soon as possible for a visit in 2 weeks.   Contact information:   [address] [place] [address] [phone]        Signed: Kathryne Hitch 09/20/2012, 7:53 AM

## 2012-09-20 NOTE — Progress Notes (Signed)
CARE MANAGEMENT NOTE 09/20/2012  Patient:  [name], [name]   Account Number:  [id number]  Date Initiated:  09/18/2012  Documentation initiated by:  DAVIS,TYMEEKA  Subjective/Objective Assessment:   67 yo female admitted s/p RIGHT TOTAL HIP ARTHROPLASTY ANTERIOR APPROACH.     Action/Plan:   Home when stable   Anticipated DC Date:  09/20/2012   Anticipated DC Plan:  HOME W HOME HEALTH SERVICES      DC Planning Services  CM consult      [facility] Choice  HOME HEALTH  DURABLE MEDICAL EQUIPMENT   Choice offered to / List presented to:  C-1 Patient   DME arranged  3-N-1  Levan Hurst      DME agency  [facility]     HH arranged  HH-2 PT      [facility] agency  [facility]   Status of service:  Completed, signed off Medicare Important Message given?  NA - LOS <3 / Initial given by admissions (If response is "NO", the following Medicare IM given date fields will be blank) Date Medicare IM given:   Date Additional Medicare IM given:    Discharge Disposition:  HOME W HOME HEALTH SERVICES  Per UR Regulation:  Reviewed for med. necessity/level of care/duration of stay  If discussed at Long Length of Stay Meetings, dates discussed:    Comments:  09/20/2012 Colleen Can BSN RN CCM [phone] Pt discharging today [facility]. [facility] will start services tomorrow 09/21/2012. DME has been delivered to patient's room.

## 2012-09-20 NOTE — Progress Notes (Signed)
Patient discharged. States understanding of dishcharge instructions and RX for percocet, robaxin, ferrous sulfate, and colace.

## 2012-09-21 ENCOUNTER — Encounter (HOSPITAL_COMMUNITY): Payer: Self-pay | Admitting: Orthopaedic Surgery

## 2013-05-23 ENCOUNTER — Other Ambulatory Visit (HOSPITAL_COMMUNITY): Payer: Self-pay | Admitting: *Deleted

## 2013-05-23 ENCOUNTER — Encounter (HOSPITAL_COMMUNITY): Payer: Self-pay | Admitting: *Deleted

## 2013-05-24 ENCOUNTER — Encounter (HOSPITAL_COMMUNITY): Payer: Self-pay | Admitting: *Deleted

## 2013-05-24 ENCOUNTER — Observation Stay (HOSPITAL_COMMUNITY)
Admission: RE | Admit: 2013-05-24 | Discharge: 2013-05-25 | Disposition: A | Discharge status: Home / Self Care | Payer: Medicare Other | Source: Ambulatory Visit | Attending: Orthopaedic Surgery | Admitting: Orthopaedic Surgery

## 2013-05-24 ENCOUNTER — Encounter (HOSPITAL_COMMUNITY): Payer: Medicare Other | Admitting: Anesthesiology

## 2013-05-24 ENCOUNTER — Encounter (HOSPITAL_COMMUNITY)
Admission: RE | Disposition: A | Discharge status: Home / Self Care | Payer: Self-pay | Source: Ambulatory Visit | Attending: Orthopaedic Surgery

## 2013-05-24 ENCOUNTER — Ambulatory Visit (HOSPITAL_COMMUNITY): Payer: Medicare Other | Admitting: Anesthesiology

## 2013-05-24 DIAGNOSIS — S52502A Unspecified fracture of the lower end of left radius, initial encounter for closed fracture: Secondary | ICD-10-CM

## 2013-05-24 DIAGNOSIS — W19XXXA Unspecified fall, initial encounter: Secondary | ICD-10-CM | POA: Insufficient documentation

## 2013-05-24 DIAGNOSIS — Z87891 Personal history of nicotine dependence: Secondary | ICD-10-CM | POA: Insufficient documentation

## 2013-05-24 DIAGNOSIS — S52599A Other fractures of lower end of unspecified radius, initial encounter for closed fracture: Principal | ICD-10-CM | POA: Insufficient documentation

## 2013-05-24 DIAGNOSIS — S52509A Unspecified fracture of the lower end of unspecified radius, initial encounter for closed fracture: Secondary | ICD-10-CM

## 2013-05-24 HISTORY — PX: OPEN REDUCTION INTERNAL FIXATION (ORIF) DISTAL RADIAL FRACTURE: SHX5989

## 2013-05-24 LAB — CBC
HCT: 42.2 % (ref 36.0–46.0)
Hemoglobin: 14 g/dL (ref 12.0–15.0)
MCH: 32.3 pg (ref 26.0–34.0)
RBC: 4.33 MIL/uL (ref 3.87–5.11)

## 2013-05-24 SURGERY — OPEN REDUCTION INTERNAL FIXATION (ORIF) DISTAL RADIUS FRACTURE
Anesthesia: General | Site: Wrist | Laterality: Left

## 2013-05-24 MED ORDER — MIDAZOLAM HCL 5 MG/5ML IJ SOLN
INTRAMUSCULAR | Status: DC | PRN
  Administered 2013-05-24: 2 mg via INTRAVENOUS

## 2013-05-24 MED ORDER — FENTANYL CITRATE 0.05 MG/ML IJ SOLN
INTRAMUSCULAR | Status: DC | PRN
  Administered 2013-05-24: 100 ug via INTRAVENOUS
  Administered 2013-05-24 (×3): 50 ug via INTRAVENOUS

## 2013-05-24 MED ORDER — SODIUM CHLORIDE 0.9 % IV SOLN
INTRAVENOUS | Status: DC
  Administered 2013-05-24: 22:00:00 via INTRAVENOUS

## 2013-05-24 MED ORDER — ONDANSETRON HCL 4 MG/2ML IJ SOLN: 4.0000 mg | Freq: Four times a day (QID) | INTRAMUSCULAR | Status: DC | PRN

## 2013-05-24 MED ORDER — DEXTROSE 5 % IV SOLN
500.0000 mg | Freq: Four times a day (QID) | INTRAVENOUS | Status: DC | PRN
  Filled 2013-05-24: qty 5

## 2013-05-24 MED ORDER — B COMPLEX-C PO TABS
1.0000 | ORAL_TABLET | Freq: Every day | ORAL | Status: DC
  Administered 2013-05-24 – 2013-05-25 (×2): 1 via ORAL
  Filled 2013-05-24 (×3): qty 1

## 2013-05-24 MED ORDER — LACTATED RINGERS IV SOLN: INTRAVENOUS | Status: DC | PRN

## 2013-05-24 MED ORDER — METOCLOPRAMIDE HCL 10 MG PO TABS: 5.0000 mg | ORAL_TABLET | Freq: Three times a day (TID) | ORAL | Status: DC | PRN

## 2013-05-24 MED ORDER — LACTATED RINGERS IV SOLN
INTRAVENOUS | Status: DC
  Administered 2013-05-24 (×2): via INTRAVENOUS

## 2013-05-24 MED ORDER — METHOCARBAMOL 500 MG PO TABS
500.0000 mg | ORAL_TABLET | Freq: Four times a day (QID) | ORAL | Status: DC | PRN
  Administered 2013-05-24 – 2013-05-25 (×3): 500 mg via ORAL
  Filled 2013-05-24 (×3): qty 1

## 2013-05-24 MED ORDER — HYDROMORPHONE HCL PF 1 MG/ML IJ SOLN
0.2500 mg | INTRAMUSCULAR | Status: DC | PRN
  Administered 2013-05-24 (×2): 0.5 mg via INTRAVENOUS

## 2013-05-24 MED ORDER — ADULT MULTIVITAMIN W/MINERALS CH
1.0000 | ORAL_TABLET | Freq: Every day | ORAL | Status: DC
  Administered 2013-05-24 – 2013-05-25 (×2): 1 via ORAL
  Filled 2013-05-24 (×3): qty 1

## 2013-05-24 MED ORDER — ONDANSETRON HCL 4 MG/2ML IJ SOLN
INTRAMUSCULAR | Status: DC | PRN
  Administered 2013-05-24: 4 mg via INTRAVENOUS

## 2013-05-24 MED ORDER — PROMETHAZINE HCL 25 MG/ML IJ SOLN: 6.2500 mg | INTRAMUSCULAR | Status: DC | PRN

## 2013-05-24 MED ORDER — POLYETHYLENE GLYCOL 3350 17 G PO PACK
17.0000 g | PACK | Freq: Every day | ORAL | Status: DC
  Administered 2013-05-24: 17 g via ORAL
  Filled 2013-05-24 (×2): qty 1

## 2013-05-24 MED ORDER — CEFAZOLIN SODIUM-DEXTROSE 2-3 GM-% IV SOLR
INTRAVENOUS | Status: AC
  Administered 2013-05-24: 2 g via INTRAVENOUS
  Filled 2013-05-24: qty 50

## 2013-05-24 MED ORDER — LIDOCAINE HCL (CARDIAC) 20 MG/ML IV SOLN
INTRAVENOUS | Status: DC | PRN
  Administered 2013-05-24: 100 mg via INTRAVENOUS

## 2013-05-24 MED ORDER — 0.9 % SODIUM CHLORIDE (POUR BTL) OPTIME
TOPICAL | Status: DC | PRN
  Administered 2013-05-24: 1000 mL

## 2013-05-24 MED ORDER — BUPIVACAINE HCL (PF) 0.25 % IJ SOLN
INTRAMUSCULAR | Status: DC | PRN
  Administered 2013-05-24: 10 mL

## 2013-05-24 MED ORDER — DIPHENHYDRAMINE HCL 12.5 MG/5ML PO ELIX: 12.5000 mg | ORAL_SOLUTION | ORAL | Status: DC | PRN

## 2013-05-24 MED ORDER — METOCLOPRAMIDE HCL 5 MG/ML IJ SOLN: 5.0000 mg | Freq: Three times a day (TID) | INTRAMUSCULAR | Status: DC | PRN

## 2013-05-24 MED ORDER — ALPRAZOLAM 0.25 MG PO TABS: 0.2500 mg | ORAL_TABLET | Freq: Every day | ORAL | Status: DC

## 2013-05-24 MED ORDER — ONDANSETRON HCL 4 MG PO TABS: 4.0000 mg | ORAL_TABLET | Freq: Four times a day (QID) | ORAL | Status: DC | PRN

## 2013-05-24 MED ORDER — CEFAZOLIN SODIUM 1-5 GM-% IV SOLN
1.0000 g | Freq: Four times a day (QID) | INTRAVENOUS | Status: AC
  Administered 2013-05-24 – 2013-05-25 (×3): 1 g via INTRAVENOUS
  Filled 2013-05-24 (×3): qty 50

## 2013-05-24 MED ORDER — ZOLPIDEM TARTRATE 5 MG PO TABS: 5.0000 mg | ORAL_TABLET | Freq: Every evening | ORAL | Status: DC | PRN

## 2013-05-24 MED ORDER — MORPHINE SULFATE 2 MG/ML IJ SOLN
1.0000 mg | INTRAMUSCULAR | Status: DC | PRN
  Administered 2013-05-24 – 2013-05-25 (×2): 1 mg via INTRAVENOUS
  Filled 2013-05-24 (×2): qty 1

## 2013-05-24 MED ORDER — PANTOPRAZOLE SODIUM 40 MG PO TBEC
40.0000 mg | DELAYED_RELEASE_TABLET | Freq: Every day | ORAL | Status: DC
  Administered 2013-05-25: 40 mg via ORAL
  Filled 2013-05-24: qty 1

## 2013-05-24 MED ORDER — OXYCODONE HCL 5 MG PO TABS
5.0000 mg | ORAL_TABLET | Freq: Once | ORAL | Status: AC
  Administered 2013-05-24: 5 mg via ORAL
  Filled 2013-05-24: qty 1

## 2013-05-24 MED ORDER — TRAMADOL HCL 50 MG PO TABS: 100.0000 mg | ORAL_TABLET | Freq: Four times a day (QID) | ORAL | Status: DC | PRN

## 2013-05-24 MED ORDER — METHOCARBAMOL 500 MG PO TABS
ORAL_TABLET | ORAL | Status: AC
  Filled 2013-05-24: qty 1

## 2013-05-24 MED ORDER — PHENYLEPHRINE HCL 10 MG/ML IJ SOLN
INTRAMUSCULAR | Status: DC | PRN
  Administered 2013-05-24: 120 ug via INTRAVENOUS
  Administered 2013-05-24 (×2): 80 ug via INTRAVENOUS
  Administered 2013-05-24: 40 ug via INTRAVENOUS
  Administered 2013-05-24: 80 ug via INTRAVENOUS

## 2013-05-24 MED ORDER — HYDROMORPHONE HCL PF 1 MG/ML IJ SOLN
INTRAMUSCULAR | Status: AC
  Filled 2013-05-24: qty 1

## 2013-05-24 MED ORDER — PROPOFOL 10 MG/ML IV BOLUS
INTRAVENOUS | Status: DC | PRN
  Administered 2013-05-24: 180 mg via INTRAVENOUS

## 2013-05-24 MED ORDER — BUPIVACAINE HCL (PF) 0.25 % IJ SOLN
INTRAMUSCULAR | Status: AC
Start: 2013-05-24 — End: 2013-05-24
  Filled 2013-05-24: qty 30

## 2013-05-24 MED ORDER — OXYCODONE HCL 5 MG PO TABS
5.0000 mg | ORAL_TABLET | ORAL | Status: DC | PRN
  Administered 2013-05-24 – 2013-05-25 (×4): 10 mg via ORAL
  Filled 2013-05-24 (×4): qty 2

## 2013-05-24 SURGICAL SUPPLY — 62 items
BANDAGE ELASTIC 3 VELCRO ST LF (GAUZE/BANDAGES/DRESSINGS) ×2 IMPLANT
BANDAGE ELASTIC 4 VELCRO ST LF (GAUZE/BANDAGES/DRESSINGS) ×2 IMPLANT
BANDAGE GAUZE ELAST BULKY 4 IN (GAUZE/BANDAGES/DRESSINGS) ×2 IMPLANT
BIT DRILL 2 FAST STEP (BIT) ×2 IMPLANT
BIT DRILL 2.5X4 QC (BIT) ×2 IMPLANT
BNDG ESMARK 4X9 LF (GAUZE/BANDAGES/DRESSINGS) ×2 IMPLANT
CANISTER SUCTION 2500CC (MISCELLANEOUS) ×2 IMPLANT
CLOTH BEACON ORANGE TIMEOUT ST (SAFETY) ×2 IMPLANT
CORDS BIPOLAR (ELECTRODE) ×2 IMPLANT
COVER SURGICAL LIGHT HANDLE (MISCELLANEOUS) ×2 IMPLANT
CUFF TOURNIQUET SINGLE 18IN (TOURNIQUET CUFF) ×2 IMPLANT
CUFF TOURNIQUET SINGLE 24IN (TOURNIQUET CUFF) IMPLANT
DRAPE OEC MINIVIEW 54X84 (DRAPES) ×2 IMPLANT
DRIVER PEG 2.0 FAST (Orthopedic Implant) ×2 IMPLANT
DURAPREP 26ML APPLICATOR (WOUND CARE) ×2 IMPLANT
ELECT REM PT RETURN 9FT ADLT (ELECTROSURGICAL) ×2
ELECTRODE REM PT RTRN 9FT ADLT (ELECTROSURGICAL) ×1 IMPLANT
FAST PDG DRIVER 2.0MM ×2 IMPLANT
GAUZE XEROFORM 1X8 LF (GAUZE/BANDAGES/DRESSINGS) ×2 IMPLANT
GLOVE BIO SURGEON STRL SZ8 (GLOVE) ×2 IMPLANT
GLOVE BIOGEL PI IND STRL 8 (GLOVE) ×1 IMPLANT
GLOVE BIOGEL PI INDICATOR 8 (GLOVE) ×1
GLOVE ORTHO TXT STRL SZ7.5 (GLOVE) ×2 IMPLANT
GOWN PREVENTION PLUS LG XLONG (DISPOSABLE) IMPLANT
GOWN PREVENTION PLUS XLARGE (GOWN DISPOSABLE) ×4 IMPLANT
GOWN STRL NON-REIN LRG LVL3 (GOWN DISPOSABLE) ×2 IMPLANT
K-WIRE 1.6 (WIRE) ×1
K-WIRE FX5X1.6XNS BN SS (WIRE) ×1
KIT BASIN OR (CUSTOM PROCEDURE TRAY) ×2 IMPLANT
KIT ROOM TURNOVER OR (KITS) ×2 IMPLANT
KWIRE FX5X1.6XNS BN SS (WIRE) ×1 IMPLANT
MANIFOLD NEPTUNE II (INSTRUMENTS) IMPLANT
NEEDLE 22X1 1/2 (OR ONLY) (NEEDLE) ×2 IMPLANT
NS IRRIG 1000ML POUR BTL (IV SOLUTION) ×2 IMPLANT
PACK ORTHO EXTREMITY (CUSTOM PROCEDURE TRAY) ×2 IMPLANT
PAD ARMBOARD 7.5X6 YLW CONV (MISCELLANEOUS) ×4 IMPLANT
PAD CAST 4YDX4 CTTN HI CHSV (CAST SUPPLIES) ×1 IMPLANT
PADDING CAST COTTON 4X4 STRL (CAST SUPPLIES) ×1
PEG SUBCHONDRAL SMOOTH 2.0X16 (Peg) ×4 IMPLANT
PEG SUBCHONDRAL SMOOTH 2.0X20 (Peg) ×4 IMPLANT
PEG SUBCHONDRAL SMOOTH 2.0X22 (Peg) ×6 IMPLANT
PLATE SHORT 57 NRRW LT (Plate) ×2 IMPLANT
SCREW BN 12X3.5XNS CORT TI (Screw) ×3 IMPLANT
SCREW CORT 3.5X12 (Screw) ×3 IMPLANT
SCREW CORT 3.5X14 LNG (Screw) ×4 IMPLANT
SCREW MULTI DIRECT 20MM (Screw) ×2 IMPLANT
SPONGE GAUZE 4X4 12PLY (GAUZE/BANDAGES/DRESSINGS) ×2 IMPLANT
SPONGE LAP 4X18 X RAY DECT (DISPOSABLE) ×2 IMPLANT
STRIP CLOSURE SKIN 1/2X4 (GAUZE/BANDAGES/DRESSINGS) ×2 IMPLANT
SUCTION FRAZIER TIP 10 FR DISP (SUCTIONS) ×2 IMPLANT
SUT ETHILON 3 0 PS 1 (SUTURE) ×4 IMPLANT
SUT PROLENE 3 0 PS 1 (SUTURE) IMPLANT
SUT VIC AB 2-0 CT1 27 (SUTURE) ×1
SUT VIC AB 2-0 CT1 TAPERPNT 27 (SUTURE) ×1 IMPLANT
SUT VIC AB 3-0 X1 27 (SUTURE) IMPLANT
SUT VICRYL 4-0 PS2 18IN ABS (SUTURE) IMPLANT
SYR CONTROL 10ML LL (SYRINGE) ×2 IMPLANT
TOWEL OR 17X24 6PK STRL BLUE (TOWEL DISPOSABLE) ×2 IMPLANT
TOWEL OR 17X26 10 PK STRL BLUE (TOWEL DISPOSABLE) ×2 IMPLANT
TUBE CONNECTING 12X1/4 (SUCTIONS) ×2 IMPLANT
UNDERPAD 30X30 INCONTINENT (UNDERPADS AND DIAPERS) ×2 IMPLANT
WATER STERILE IRR 1000ML POUR (IV SOLUTION) IMPLANT

## 2013-05-24 NOTE — Preoperative (Signed)
Beta Blockers   Reason not to administer Beta Blockers:Not Applicable 

## 2013-05-24 NOTE — Anesthesia Procedure Notes (Signed)
Procedure Name: LMA Insertion Date/Time: 05/24/2013 4:24 PM Performed by: Lovie Chol Pre-anesthesia Checklist: Emergency Drugs available, Patient identified, Suction available, Patient being monitored and Timeout performed Patient Re-evaluated:Patient Re-evaluated prior to inductionOxygen Delivery Method: Circle system utilized Preoxygenation: Pre-oxygenation with 100% oxygen Intubation Type: IV induction Ventilation: Mask ventilation without difficulty LMA: LMA inserted LMA Size: 4.0 Number of attempts: 1 Placement Confirmation: positive ETCO2,  CO2 detector and breath sounds checked- equal and bilateral Tube secured with: Tape Dental Injury: Teeth and Oropharynx as per pre-operative assessment

## 2013-05-24 NOTE — Transfer of Care (Signed)
Immediate Anesthesia Transfer of Care Note  Patient: [name]  Procedure(s) Performed: Procedure(s): OPEN REDUCTION INTERNAL FIXATION (ORIF) LEFT DISTAL RADIUS FRACTURE (Left)  Patient Location: PACU  Anesthesia Type:General  Level of Consciousness: awake, alert  and oriented  Airway & Oxygen Therapy: Patient Spontanous Breathing and Patient connected to nasal cannula oxygen  Post-op Assessment: Report given to PACU RN  Post vital signs: Reviewed and stable  Complications: No apparent anesthesia complications

## 2013-05-24 NOTE — Brief Op Note (Signed)
05/24/2013  5:33 PM  PATIENT:  [name]  67 y.o. female  PRE-OPERATIVE DIAGNOSIS:  Displaced left distal radius fracture  POST-OPERATIVE DIAGNOSIS:  Displaced left distal radius fracture  PROCEDURE:  Procedure(s): OPEN REDUCTION INTERNAL FIXATION (ORIF) LEFT DISTAL RADIUS FRACTURE (Left)  SURGEON:  Surgeon(s) and Role:    * Kathryne Hitch, MD - Primary  PHYSICIAN ASSISTANT: Rexene Edison, PA-C  ANESTHESIA:   local and general  EBL:  Total I/O In: 1000 [I.V.:1000] Out: -   BLOOD ADMINISTERED:none  DRAINS: none   LOCAL MEDICATIONS USED:  MARCAINE     SPECIMEN:  No Specimen  DISPOSITION OF SPECIMEN:  N/A  COUNTS:  YES  TOURNIQUET:  * Missing tourniquet times found for documented tourniquets in log:  [id number] *  DICTATION: .Other Dictation: Dictation Number [id number]  PLAN OF CARE: Admit to inpatient   PATIENT DISPOSITION:  PACU - hemodynamically stable.   Delay start of Pharmacological VTE agent (>24hrs) due to surgical blood loss or risk of bleeding: no

## 2013-05-24 NOTE — H&P (Signed)
[name] is an 67 y.o. female.   Chief Complaint:   Severe left wrist pain; known fracture HPI:   67 yo female well-known to me who sustained a mechanical fall onto an outstretched left arm this past Saturday evening.  Due to continued left wrist pain, swelling, and an obvious deformity, she came to the office yesterday for further eval and tx.  In the office, she was found to have a severely displaced and angulated left distal radius fracture and surgery is recommended to improve the overall alignment.  She has seen her x-rays and fully understands the reasoning behind proceeding to surgery.  Past Medical History  Diagnosis Date  . Arthritis   . Osteopenia   . GERD (gastroesophageal reflux disease)   . H/O hiatal hernia   . Anxiety   . Headache(784.0)     occasional    Past Surgical History  Procedure Laterality Date  . Laparoscopy      MANY YRS AGO  . Abdominal hysterectomy  2001  . Total hip arthroplasty Right 09/17/2012    Procedure: RIGHT TOTAL HIP ARTHROPLASTY ANTERIOR APPROACH;  Surgeon: Kathryne Hitch, MD;  Location: [facility];  Service: Orthopedics;  Laterality: Right;  . Colonoscopy    . Tubal ligation      History reviewed. No pertinent family history. Social History:  reports that she quit smoking about a year ago. She has never used smokeless tobacco. She reports that she drinks alcohol. She reports that she does not use illicit drugs.  Allergies:  Allergies  Allergen Reactions  . Codeine Nausea Only    REACTION: gi upset    No prescriptions prior to admission    No results found for this or any previous visit (from the past 48 hour(s)). No results found.  Review of Systems  All other systems reviewed and are negative.    There were no vitals taken for this visit. Physical Exam  Constitutional: She is oriented to person, place, and time. She appears well-developed and well-nourished.  HENT:  Head: Normocephalic and atraumatic.  Eyes: EOM are  normal. Pupils are equal, round, and reactive to light.  Neck: Normal range of motion. Neck supple.  Cardiovascular: Normal rate and regular rhythm.   Respiratory: Effort normal and breath sounds normal.  GI: Soft. Bowel sounds are normal.  Musculoskeletal:       Left wrist: She exhibits decreased range of motion, bony tenderness, swelling and deformity.  Neurological: She is alert and oriented to person, place, and time.  Skin: Skin is warm and dry.  Psychiatric: She has a normal mood and affect.     Assessment/Plan Left extra-articular, displace distal radius fracture 1)  To the OR today for open reduction/internal fixation of her left distal radius fracture due to severe displacement.  Then admission overnight for pain meds, IV antibiotics, and nausea meds.  BLACKMAN,CHRISTOPHER Y 05/24/2013, 12:15 PM

## 2013-05-24 NOTE — Anesthesia Preprocedure Evaluation (Addendum)
Anesthesia Evaluation  Patient identified by MRN, date of birth, ID band Patient awake    Reviewed: Allergy & Precautions, H&P , NPO status , Patient's Chart, lab work & pertinent test results  History of Anesthesia Complications Negative for: history of anesthetic complications  Airway Mallampati: II TM Distance: >3 FB Neck ROM: Full    Dental  (+) Teeth Intact and Dental Advisory Given   Pulmonary former smoker,  breath sounds clear to auscultation        Cardiovascular negative cardio ROS  Rhythm:regular Rate:Normal     Neuro/Psych negative neurological ROS  negative psych ROS   GI/Hepatic negative GI ROS, Neg liver ROS, hiatal hernia, GERD-  Medicated and Controlled,  Endo/Other  negative endocrine ROS  Renal/GU negative Renal ROS     Musculoskeletal   Abdominal   Peds  Hematology   Anesthesia Other Findings   Reproductive/Obstetrics negative OB ROS                         Anesthesia Physical Anesthesia Plan  ASA: II  Anesthesia Plan: General LMA   Post-op Pain Management: MAC Combined w/ Regional for Post-op pain   Induction: Intravenous  Airway Management Planned: LMA  Additional Equipment:   Intra-op Plan:   Post-operative Plan: Extubation in OR  Informed Consent: I have reviewed the patients History and Physical, chart, labs and discussed the procedure including the risks, benefits and alternatives for the proposed anesthesia with the patient or authorized representative who has indicated his/her understanding and acceptance.   Dental advisory given  Plan Discussed with: Anesthesiologist, CRNA and Surgeon  Anesthesia Plan Comments:        Anesthesia Quick Evaluation

## 2013-05-25 MED ORDER — OXYCODONE-ACETAMINOPHEN 5-325 MG PO TABS: 1.0000 | ORAL_TABLET | ORAL | Status: DC | PRN

## 2013-05-25 NOTE — Progress Notes (Signed)
Utilization review completed.  

## 2013-05-25 NOTE — Progress Notes (Signed)
Discharge instructions and rx given and explained at 1045. Patient ready, IV was removed. Awaiting on physician to sign rx.

## 2013-05-25 NOTE — Op Note (Signed)
[name], [name]                ACCOUNT NO.:  [id number]  MEDICAL RECORD NO.:  [id number]  LOCATION:  [id number]                        FACILITY:  [facility]  PHYSICIAN:  Vanita Panda. Magnus Ivan, M.D.DATE OF BIRTH:  Nov 01, 1945  DATE OF PROCEDURE:  05/24/2013 DATE OF DISCHARGE:  05/25/2013                              OPERATIVE REPORT   PREOPERATIVE DIAGNOSIS:  Displaced left wrist extra-articular distal radius fracture.  POSTOPERATIVE DIAGNOSIS:  Displaced left wrist 3-part intra-articular distal radius fracture.  PROCEDURE:  Open reduction and internal fixation of left distal radius fracture using volar plating.  SURGEON:  Vanita Panda. Magnus Ivan, M.D.  ANESTHESIA: 1. General. 2. Local with 0.25% plain Marcaine.  TOURNIQUET TIME:  Under 1 hour.  ESTIMATED BLOOD LOSS:  Less than 30 mL.  COMPLICATIONS:  None.  INDICATIONS:  [name] is a very well-known patient to me.  She is a 67 year old and had a previous total hip replacement in April of this year.  She sustained a mechanical fall just accidentally this past Saturday night on an outstretched left arm.  She was seen in the office yesterday due to continued pain and swelling and deformity of her left wrist, and was found to have what I felt was an extra-articular distal radius fracture, but there was significant dorsal angulation and shortening.  I recommended she undergo open reduction and internal fixation.  She understands the risks and benefits of this and does wish to proceed with surgery.  DESCRIPTION OF PROCEDURE:  After informed consent was obtained, the appropriate left wrist was marked.  She was brought to the operating room, placed supine on the operating table.  General anesthesia was then obtained.  Her left arm was placed on radiolucent arm table.  Nonsterile tourniquet was placed around her upper left arm.  The left hand, wrist, and forearm were prepped and draped with DuraPrep and sterile drapes. Time-out  was called to identify correct patient, correct left wrist.  We then used Esmarch wrap out the wrist and tourniquet was inflated to 250 mm of pressure.  I then took a standard volar approach to the distal radius, dissected down at interval between the flexor carpi radialis tendon and the radial artery.  I was able to easily identify the fracture and with traction and reduction maneuver, we were able to reduce it anatomically.  I did find actually intra-articular component to this.  I then chose a hand innovations narrow and short distal radial volar locking plate and fashioned this along the volar cortex and secured this with bicortical screws proximally and pegs and screws distally.  Once this was accomplished, we put the right wrist through a range of motion.  It was entirely stable and again the fracture was out to length.  We then copiously irrigated the soft tissues with normal saline solution.  I reapproximated the soft tissue with interrupted 2-0 Vicryl suture followed by interrupted 3-0 nylon on the skin.  We infiltrated the incision with 4% plain Sensorcaine, placed Xeroform and well-padded soft dressing.  Tourniquet was let down.  The fingers pinked nicely.  She was awakened, extubated, and taken to the recovery room in stable condition.  All final counts were correct.  There were no complications noted.  Of note, Jocelyn Canal, PA-C. who assisted in her entire case and his assistance was crucial for reduction and holding the reduction while we got the plate and screws in place.     Vanita Panda. Magnus Ivan, M.D.     CYB/MEDQ  D:  05/24/2013  T:  05/25/2013  Job:  [id number]

## 2013-05-25 NOTE — Anesthesia Postprocedure Evaluation (Signed)
Anesthesia Post Note  Patient: [name]  Procedure(s) Performed: Procedure(s) (LRB): OPEN REDUCTION INTERNAL FIXATION (ORIF) LEFT DISTAL RADIUS FRACTURE (Left)  Anesthesia type: general  Patient location: PACU  Post pain: Pain level controlled  Post assessment: Patient's Cardiovascular Status Stable  Post vital signs: Reviewed and stable  Level of consciousness: sedated  Complications: No apparent anesthesia complications

## 2013-05-25 NOTE — Progress Notes (Signed)
Subjective: 1 Day Post-Op Procedure(s) (LRB): OPEN REDUCTION INTERNAL FIXATION (ORIF) LEFT DISTAL RADIUS FRACTURE (Left) Patient reports pain as moderate.    Objective: Vital signs in last 24 hours: Temp:  [97.8 F (36.6 C)-99.5 F (37.5 C)] 99.5 F (37.5 C) (12/10 0604) Pulse Rate:  [69-98] 97 (12/10 0604) Resp:  [10-20] 16 (12/10 0604) BP: (90-162)/(54-93) 106/55 mmHg (12/10 0731) SpO2:  [94 %-100 %] 94 % (12/10 0604) Weight:  [62.7 kg (138 lb 3.7 oz)] 62.7 kg (138 lb 3.7 oz) (12/09 1336)  Intake/Output from previous day: 12/09 0701 - 12/10 0700 In: 1000 [I.V.:1000] Out: -  Intake/Output this shift:     Recent Labs  05/24/13 1352  HGB 14.0    Recent Labs  05/24/13 1352  WBC 7.1  RBC 4.33  HCT 42.2  PLT 202   No results found for this basename: NA, K, CL, CO2, BUN, CREATININE, GLUCOSE, CALCIUM,  in the last 72 hours No results found for this basename: LABPT, INR,  in the last 72 hours  Intact pulses distally Incision: scant drainage No cellulitis present Compartment soft Moves fingers and thumb. Some decreased sensation median nerve distribution, but same as pre-op   Assessment/Plan: 1 Day Post-Op Procedure(s) (LRB): OPEN REDUCTION INTERNAL FIXATION (ORIF) LEFT DISTAL RADIUS FRACTURE (Left) Discharge to home today.  Hutson Luft Y 05/25/2013, 7:38 AM

## 2013-05-25 NOTE — Progress Notes (Signed)
Patient left unit in a stable condition via wheelchair accompanied by husband.

## 2013-05-25 NOTE — Discharge Summary (Signed)
Patient ID: [name] MRN: [id number] DOB/AGE: [birth year] 66 y.o.  Admit date: 05/24/2013 Discharge date: 05/25/2013  Admission Diagnoses:  Principal Problem:   Closed fracture of left distal radius Active Problems:   Distal radius fracture   Discharge Diagnoses:  Same  Past Medical History  Diagnosis Date  . Arthritis   . Osteopenia   . GERD (gastroesophageal reflux disease)   . H/O hiatal hernia   . Anxiety   . Headache(784.0)     occasional    Surgeries: Procedure(s): OPEN REDUCTION INTERNAL FIXATION (ORIF) LEFT DISTAL RADIUS FRACTURE on 05/24/2013   Consultants:    Discharged Condition: Improved  Hospital Course: [name] is an 67 y.o. female who was admitted 05/24/2013 for operative treatment ofClosed fracture of left distal radius. Patient has severe unremitting pain that affects sleep, daily activities, and work/hobbies. After pre-op clearance the patient was taken to the operating room on 05/24/2013 and underwent  Procedure(s): OPEN REDUCTION INTERNAL FIXATION (ORIF) LEFT DISTAL RADIUS FRACTURE.    Patient was given perioperative antibiotics: Anti-infectives   Start     Dose/Rate Route Frequency Ordered Stop   05/24/13 1930  ceFAZolin (ANCEF) IVPB 1 g/50 mL premix     1 g 100 mL/hr over 30 Minutes Intravenous Every 6 hours 05/24/13 1921 05/25/13 1329   05/24/13 1612  ceFAZolin (ANCEF) 2-3 GM-% IVPB SOLR    Comments:  [name]   : cabinet override      05/24/13 1612 05/24/13 1625       Patient was given sequential compression devices, early ambulation, and chemoprophylaxis to prevent DVT.  Patient benefited maximally from hospital stay and there were no complications.    Recent vital signs: Patient Vitals for the past 24 hrs:  BP Temp Temp src Pulse Resp SpO2 Height Weight  05/25/13 0731 106/55 mmHg - - - - - - -  05/25/13 0604 90/54 mmHg 99.5 F (37.5 C) Oral 97 16 94 % - -  05/25/13 0200 107/56 mmHg 98.6 F (37 C) - 69 16 98 % - -   05/24/13 2025 144/78 mmHg 97.8 F (36.6 C) - 94 16 94 % - -  05/24/13 1915 147/81 mmHg 98.1 F (36.7 C) - 89 12 100 % - -  05/24/13 1830 152/69 mmHg 97.9 F (36.6 C) - 86 10 96 % - -  05/24/13 1815 157/79 mmHg - - 89 19 100 % - -  05/24/13 1800 162/85 mmHg - - 93 12 100 % - -  05/24/13 1752 161/93 mmHg 98.1 F (36.7 C) - 98 13 100 % - -  05/24/13 1336 159/81 mmHg 98.2 F (36.8 C) Oral 84 20 98 % 5\' 4"  (1.626 m) 62.7 kg (138 lb 3.7 oz)     Recent laboratory studies:  Recent Labs  05/24/13 1352  WBC 7.1  HGB 14.0  HCT 42.2  PLT 202     Discharge Medications:     Medication List         ALPRAZolam 0.25 MG tablet  Commonly known as:  XANAX  Take 0.25 mg by mouth daily with lunch.     B-complex with vitamin C tablet  Take 1 tablet by mouth daily.     meloxicam 15 MG tablet  Commonly known as:  MOBIC  Take 15 mg by mouth daily.     multivitamin with minerals Tabs tablet  Take 1 tablet by mouth daily.     oxyCODONE-acetaminophen 5-325 MG per tablet  Commonly known as:  ROXICET  Take 1-2 tablets by mouth every 4 (four) hours as needed for severe pain.     pantoprazole 40 MG tablet  Commonly known as:  PROTONIX  Take 40 mg by mouth daily.     polyethylene glycol packet  Commonly known as:  MIRALAX / GLYCOLAX  Take 17 g by mouth daily.     SYSTANE OP  Apply 1 drop to eye 2 (two) times daily as needed (for dry eyes).     Vitamin D 2000 UNITS tablet  Take 2,000 Units by mouth daily.        Diagnostic Studies: No results found.  Disposition:  To home      Discharge Orders   Future Orders Complete By Expires   Call MD / Call 911  As directed    Comments:     If you experience chest pain or shortness of breath, CALL 911 and be transported to the hospital emergency room.  If you develope a fever above 101 F, pus (white drainage) or increased drainage or redness at the wound, or calf pain, call your surgeon's office.   Constipation Prevention  As directed     Comments:     Drink plenty of fluids.  Prune juice may be helpful.  You may use a stool softener, such as Colace (over the counter) 100 mg twice a day.  Use MiraLax (over the counter) for constipation as needed.   Diet - low sodium heart healthy  As directed    Discharge instructions  As directed    Comments:     Leave your current dressing on until this coming Friday 12/12; then remove it and place new dry dressing daily to protect the sutures. Wait until Sunday 12/14 to get your actual incision wet in the shower. Ice and elevation for swelling. Use your hand and move your fingers as comfort allows. Wear your removable wrist splint as comfort allows starting 12/12.   Discharge patient  As directed    Increase activity slowly as tolerated  As directed       Follow-up Information   Follow up with Kathryne Hitch, MD In 2 weeks.   Specialty:  Orthopedic Surgery   Contact information:   [address] [place] [address] [phone]        Signed: Kathryne Hitch 05/25/2013, 7:42 AM

## 2013-05-26 ENCOUNTER — Encounter (HOSPITAL_COMMUNITY): Payer: Self-pay | Admitting: Orthopaedic Surgery

## 2013-07-13 ENCOUNTER — Other Ambulatory Visit (HOSPITAL_COMMUNITY): Payer: Self-pay | Admitting: Orthopaedic Surgery

## 2013-07-27 ENCOUNTER — Encounter (HOSPITAL_COMMUNITY): Payer: Self-pay | Admitting: Pharmacy Technician

## 2013-07-29 ENCOUNTER — Encounter (HOSPITAL_COMMUNITY): Payer: Self-pay

## 2013-07-29 ENCOUNTER — Encounter (HOSPITAL_COMMUNITY)
Admission: RE | Admit: 2013-07-29 | Discharge: 2013-07-29 | Disposition: A | Discharge status: Home / Self Care | Payer: Medicare Other | Source: Ambulatory Visit | Attending: Orthopaedic Surgery | Admitting: Orthopaedic Surgery

## 2013-07-29 DIAGNOSIS — Z01812 Encounter for preprocedural laboratory examination: Secondary | ICD-10-CM | POA: Insufficient documentation

## 2013-07-29 LAB — URINALYSIS, ROUTINE W REFLEX MICROSCOPIC
BILIRUBIN URINE: NEGATIVE
GLUCOSE, UA: NEGATIVE mg/dL
HGB URINE DIPSTICK: NEGATIVE
Ketones, ur: NEGATIVE mg/dL
Leukocytes, UA: NEGATIVE
Nitrite: NEGATIVE
PH: 6 (ref 5.0–8.0)
Protein, ur: NEGATIVE mg/dL
SPECIFIC GRAVITY, URINE: 1.022 (ref 1.005–1.030)
Urobilinogen, UA: 0.2 mg/dL (ref 0.0–1.0)

## 2013-07-29 LAB — SURGICAL PCR SCREEN
MRSA, PCR: NEGATIVE
STAPHYLOCOCCUS AUREUS: NEGATIVE

## 2013-07-29 LAB — PROTIME-INR
INR: 0.97 (ref 0.00–1.49)
Prothrombin Time: 12.7 seconds (ref 11.6–15.2)

## 2013-07-29 LAB — BASIC METABOLIC PANEL
BUN: 26 mg/dL — ABNORMAL HIGH (ref 6–23)
CHLORIDE: 101 meq/L (ref 96–112)
CO2: 28 meq/L (ref 19–32)
CREATININE: 0.7 mg/dL (ref 0.50–1.10)
Calcium: 9.6 mg/dL (ref 8.4–10.5)
GFR calc Af Amer: >90 mL/min (ref >90)
GFR calc non Af Amer: 88 mL/min — ABNORMAL LOW (ref >90)
Glucose, Bld: 86 mg/dL (ref 70–99)
Potassium: 5.5 mEq/L — ABNORMAL HIGH (ref 3.7–5.3)
SODIUM: 140 meq/L (ref 137–147)

## 2013-07-29 LAB — APTT: aPTT: 28 seconds (ref 24–37)

## 2013-07-29 LAB — CBC
HEMATOCRIT: 41 % (ref 36.0–46.0)
Hemoglobin: 13.8 g/dL (ref 12.0–15.0)
MCH: 32.5 pg (ref 26.0–34.0)
MCHC: 33.7 g/dL (ref 30.0–36.0)
MCV: 96.5 fL (ref 78.0–100.0)
Platelets: 222 10*3/uL (ref 150–400)
RBC: 4.25 MIL/uL (ref 3.87–5.11)
RDW: 12.9 % (ref 11.5–15.5)
WBC: 6.5 10*3/uL (ref 4.0–10.5)

## 2013-07-29 NOTE — Patient Instructions (Signed)
Cherlynn PerchesLinda C Hipp  07/29/2013                           YOUR PROCEDURE IS SCHEDULED ON: 08/05/13               PLEASE REPORT TO SHORT STAY CENTER AT : 9:30 AM               CALL THIS NUMBER IF ANY PROBLEMS THE DAY OF SURGERY :               [phone]                                REMEMBER:   Do not eat food or drink liquids AFTER MIDNIGHT   May have clear liquids UNTIL 6 HOURS BEFORE SURGERY     Take these medicines the morning of surgery with A SIP OF WATER: XANAX / NEXIUM / ZYRTEC / ESTRADIOL / MAY TAKE PERCOCET IF NEEDED / MAY USE EYE DROPS   Do not wear jewelry, make-up   Do not wear lotions, powders, or perfumes.   Do not shave legs or underarms 12 hrs. before surgery (men may shave face)  Do not bring valuables to the hospital.  Contacts, dentures or bridgework may not be worn into surgery.  Leave suitcase in the car. After surgery it may be brought to your room.  For patients admitted to the hospital more than one night, checkout time is 11:00 AM                                                       The day of discharge.   Patients discharged the day of surgery will not be allowed to drive home.              If going home same day of surgery, must have someone stay with you first              24 hrs at home and arrange for some one to drive you home from hospital.    Special Instructions:   Please read over the following fact sheets that you were given:               1. [facility] PREPARING FOR SURGERY SHEET                2. MRSA INSTRUCTION SHEET                                                X_____________________________________________________________________        Failure to follow these instructions may result in cancellation of your surgery

## 2013-08-05 ENCOUNTER — Encounter (HOSPITAL_COMMUNITY)
Admission: RE | Disposition: A | Discharge status: Home Health | Payer: Self-pay | Source: Ambulatory Visit | Attending: Orthopaedic Surgery

## 2013-08-05 ENCOUNTER — Encounter (HOSPITAL_COMMUNITY): Payer: Self-pay | Admitting: *Deleted

## 2013-08-05 ENCOUNTER — Inpatient Hospital Stay (HOSPITAL_COMMUNITY): Payer: Medicare Other

## 2013-08-05 ENCOUNTER — Inpatient Hospital Stay (HOSPITAL_COMMUNITY)
Admission: RE | Admit: 2013-08-05 | Discharge: 2013-08-07 | DRG: 470 | Disposition: A | Discharge status: Home Health | Payer: Medicare Other | Source: Ambulatory Visit | Attending: Orthopaedic Surgery | Admitting: Orthopaedic Surgery

## 2013-08-05 ENCOUNTER — Inpatient Hospital Stay (HOSPITAL_COMMUNITY): Payer: Medicare Other | Admitting: Anesthesiology

## 2013-08-05 ENCOUNTER — Encounter (HOSPITAL_COMMUNITY): Payer: Medicare Other | Admitting: Anesthesiology

## 2013-08-05 DIAGNOSIS — Z87891 Personal history of nicotine dependence: Secondary | ICD-10-CM

## 2013-08-05 DIAGNOSIS — M949 Disorder of cartilage, unspecified: Secondary | ICD-10-CM

## 2013-08-05 DIAGNOSIS — M1612 Unilateral primary osteoarthritis, left hip: Secondary | ICD-10-CM

## 2013-08-05 DIAGNOSIS — M2540 Effusion, unspecified joint: Secondary | ICD-10-CM | POA: Diagnosis present

## 2013-08-05 DIAGNOSIS — K589 Irritable bowel syndrome without diarrhea: Secondary | ICD-10-CM | POA: Diagnosis present

## 2013-08-05 DIAGNOSIS — M1610 Unilateral primary osteoarthritis, unspecified hip: Principal | ICD-10-CM | POA: Diagnosis present

## 2013-08-05 DIAGNOSIS — K219 Gastro-esophageal reflux disease without esophagitis: Secondary | ICD-10-CM | POA: Diagnosis present

## 2013-08-05 DIAGNOSIS — Z885 Allergy status to narcotic agent status: Secondary | ICD-10-CM

## 2013-08-05 DIAGNOSIS — Z96649 Presence of unspecified artificial hip joint: Secondary | ICD-10-CM

## 2013-08-05 DIAGNOSIS — Z01812 Encounter for preprocedural laboratory examination: Secondary | ICD-10-CM

## 2013-08-05 DIAGNOSIS — M169 Osteoarthritis of hip, unspecified: Principal | ICD-10-CM | POA: Diagnosis present

## 2013-08-05 DIAGNOSIS — M899 Disorder of bone, unspecified: Secondary | ICD-10-CM | POA: Diagnosis present

## 2013-08-05 DIAGNOSIS — F411 Generalized anxiety disorder: Secondary | ICD-10-CM | POA: Diagnosis present

## 2013-08-05 DIAGNOSIS — D62 Acute posthemorrhagic anemia: Secondary | ICD-10-CM | POA: Diagnosis not present

## 2013-08-05 HISTORY — PX: TOTAL HIP ARTHROPLASTY: SHX124

## 2013-08-05 LAB — TYPE AND SCREEN
ABO/RH(D): A NEG
Antibody Screen: NEGATIVE

## 2013-08-05 SURGERY — ARTHROPLASTY, HIP, TOTAL, ANTERIOR APPROACH
Anesthesia: General | Site: Hip | Laterality: Left

## 2013-08-05 MED ORDER — ACETAMINOPHEN 10 MG/ML IV SOLN
1000.0000 mg | Freq: Once | INTRAVENOUS | Status: AC
  Administered 2013-08-05: 1000 mg via INTRAVENOUS
  Filled 2013-08-05: qty 100

## 2013-08-05 MED ORDER — HYDROMORPHONE HCL PF 1 MG/ML IJ SOLN
0.5000 mg | INTRAMUSCULAR | Status: DC | PRN
  Administered 2013-08-05 – 2013-08-07 (×2): 1 mg via INTRAVENOUS
  Filled 2013-08-05 (×2): qty 1

## 2013-08-05 MED ORDER — METHOCARBAMOL 100 MG/ML IJ SOLN
500.0000 mg | Freq: Four times a day (QID) | INTRAVENOUS | Status: DC | PRN
  Administered 2013-08-05: 500 mg via INTRAVENOUS
  Filled 2013-08-05: qty 5

## 2013-08-05 MED ORDER — LIDOCAINE HCL (CARDIAC) 20 MG/ML IV SOLN
INTRAVENOUS | Status: DC | PRN
  Administered 2013-08-05: 100 mg via INTRAVENOUS

## 2013-08-05 MED ORDER — ALPRAZOLAM 0.25 MG PO TABS
0.2500 mg | ORAL_TABLET | Freq: Every day | ORAL | Status: DC
  Administered 2013-08-06 – 2013-08-07 (×2): 0.25 mg via ORAL
  Filled 2013-08-05 (×2): qty 1

## 2013-08-05 MED ORDER — CHLORHEXIDINE GLUCONATE 4 % EX LIQD: 60.0000 mL | Freq: Once | CUTANEOUS | Status: DC

## 2013-08-05 MED ORDER — OXYCODONE HCL 5 MG PO TABS
5.0000 mg | ORAL_TABLET | ORAL | Status: DC | PRN
  Administered 2013-08-05 – 2013-08-06 (×9): 10 mg via ORAL
  Administered 2013-08-07 (×2): 5 mg via ORAL
  Administered 2013-08-07: 10 mg via ORAL
  Administered 2013-08-07: 5 mg via ORAL
  Filled 2013-08-05 (×8): qty 2
  Filled 2013-08-05: qty 1
  Filled 2013-08-05: qty 2
  Filled 2013-08-05: qty 1
  Filled 2013-08-05: qty 2
  Filled 2013-08-05: qty 1

## 2013-08-05 MED ORDER — ASPIRIN EC 325 MG PO TBEC
325.0000 mg | DELAYED_RELEASE_TABLET | Freq: Two times a day (BID) | ORAL | Status: DC
  Administered 2013-08-05 – 2013-08-07 (×4): 325 mg via ORAL
  Filled 2013-08-05 (×6): qty 1

## 2013-08-05 MED ORDER — SUFENTANIL CITRATE 50 MCG/ML IV SOLN
INTRAVENOUS | Status: AC
  Filled 2013-08-05: qty 1

## 2013-08-05 MED ORDER — KETAMINE HCL 10 MG/ML IJ SOLN
INTRAMUSCULAR | Status: AC
Start: 2013-08-05 — End: 2013-08-05
  Filled 2013-08-05: qty 1

## 2013-08-05 MED ORDER — ONDANSETRON HCL 4 MG/2ML IJ SOLN
4.0000 mg | Freq: Four times a day (QID) | INTRAMUSCULAR | Status: DC | PRN
  Administered 2013-08-05: 4 mg via INTRAVENOUS
  Filled 2013-08-05: qty 2

## 2013-08-05 MED ORDER — METHOCARBAMOL 500 MG PO TABS
500.0000 mg | ORAL_TABLET | Freq: Four times a day (QID) | ORAL | Status: DC | PRN
  Administered 2013-08-05 – 2013-08-07 (×5): 500 mg via ORAL
  Filled 2013-08-05 (×6): qty 1

## 2013-08-05 MED ORDER — DIPHENHYDRAMINE HCL 12.5 MG/5ML PO ELIX: 12.5000 mg | ORAL_SOLUTION | ORAL | Status: DC | PRN

## 2013-08-05 MED ORDER — PROPOFOL 10 MG/ML IV BOLUS
INTRAVENOUS | Status: DC | PRN
  Administered 2013-08-05: 130 mg via INTRAVENOUS

## 2013-08-05 MED ORDER — CEFAZOLIN SODIUM 1-5 GM-% IV SOLN
1.0000 g | Freq: Four times a day (QID) | INTRAVENOUS | Status: AC
  Administered 2013-08-05 (×2): 1 g via INTRAVENOUS
  Filled 2013-08-05 (×2): qty 50

## 2013-08-05 MED ORDER — EPHEDRINE SULFATE 50 MG/ML IJ SOLN
INTRAMUSCULAR | Status: DC | PRN
  Administered 2013-08-05 (×3): 5 mg via INTRAVENOUS

## 2013-08-05 MED ORDER — SUFENTANIL CITRATE 50 MCG/ML IV SOLN
INTRAVENOUS | Status: DC | PRN
  Administered 2013-08-05: 10 ug via INTRAVENOUS
  Administered 2013-08-05: 5 ug via INTRAVENOUS
  Administered 2013-08-05: 10 ug via INTRAVENOUS
  Administered 2013-08-05: 15 ug via INTRAVENOUS
  Administered 2013-08-05: 5 ug via INTRAVENOUS

## 2013-08-05 MED ORDER — STERILE WATER FOR IRRIGATION IR SOLN
Status: DC | PRN
  Administered 2013-08-05: 1500 mL

## 2013-08-05 MED ORDER — HYDROMORPHONE HCL PF 1 MG/ML IJ SOLN
0.5000 mg | INTRAMUSCULAR | Status: DC | PRN
Start: 2013-08-05 — End: 2013-08-05
  Administered 2013-08-05: 0.5 mg via INTRAVENOUS
  Filled 2013-08-05: qty 1

## 2013-08-05 MED ORDER — ACETAMINOPHEN 325 MG PO TABS: 650.0000 mg | ORAL_TABLET | Freq: Four times a day (QID) | ORAL | Status: DC | PRN

## 2013-08-05 MED ORDER — DOCUSATE SODIUM 100 MG PO CAPS
100.0000 mg | ORAL_CAPSULE | Freq: Two times a day (BID) | ORAL | Status: DC
  Administered 2013-08-05 – 2013-08-07 (×4): 100 mg via ORAL

## 2013-08-05 MED ORDER — CISATRACURIUM BESYLATE (PF) 10 MG/5ML IV SOLN
INTRAVENOUS | Status: DC | PRN
  Administered 2013-08-05: 5 mg via INTRAVENOUS

## 2013-08-05 MED ORDER — POLYETHYLENE GLYCOL 3350 17 G PO PACK
17.0000 g | PACK | Freq: Every day | ORAL | Status: DC
  Administered 2013-08-05 – 2013-08-07 (×3): 17 g via ORAL

## 2013-08-05 MED ORDER — LIDOCAINE HCL (CARDIAC) 20 MG/ML IV SOLN
INTRAVENOUS | Status: AC
  Filled 2013-08-05: qty 5

## 2013-08-05 MED ORDER — TRANEXAMIC ACID 100 MG/ML IV SOLN
1000.0000 mg | INTRAVENOUS | Status: AC
  Administered 2013-08-05: 1000 mg via INTRAVENOUS
  Filled 2013-08-05: qty 10

## 2013-08-05 MED ORDER — TRAMADOL HCL 50 MG PO TABS
100.0000 mg | ORAL_TABLET | Freq: Four times a day (QID) | ORAL | Status: DC | PRN
  Administered 2013-08-05 – 2013-08-07 (×4): 100 mg via ORAL
  Filled 2013-08-05 (×4): qty 2

## 2013-08-05 MED ORDER — ADULT MULTIVITAMIN W/MINERALS CH
1.0000 | ORAL_TABLET | Freq: Every day | ORAL | Status: DC
  Administered 2013-08-06 – 2013-08-07 (×2): 1 via ORAL
  Filled 2013-08-05 (×2): qty 1

## 2013-08-05 MED ORDER — MIDAZOLAM HCL 2 MG/2ML IJ SOLN
INTRAMUSCULAR | Status: AC
  Filled 2013-08-05: qty 2

## 2013-08-05 MED ORDER — KETOROLAC TROMETHAMINE 15 MG/ML IJ SOLN
7.5000 mg | Freq: Four times a day (QID) | INTRAMUSCULAR | Status: AC
  Administered 2013-08-05 – 2013-08-06 (×4): 7.5 mg via INTRAVENOUS
  Filled 2013-08-05 (×3): qty 1

## 2013-08-05 MED ORDER — CISATRACURIUM BESYLATE 20 MG/10ML IV SOLN
INTRAVENOUS | Status: AC
  Filled 2013-08-05: qty 10

## 2013-08-05 MED ORDER — DIPHENHYDRAMINE HCL 50 MG/ML IJ SOLN
INTRAMUSCULAR | Status: AC
  Filled 2013-08-05: qty 1

## 2013-08-05 MED ORDER — DIPHENHYDRAMINE HCL 50 MG/ML IJ SOLN
12.5000 mg | Freq: Once | INTRAMUSCULAR | Status: AC
  Administered 2013-08-05: 12.5 mg via INTRAVENOUS

## 2013-08-05 MED ORDER — 0.9 % SODIUM CHLORIDE (POUR BTL) OPTIME
TOPICAL | Status: DC | PRN
  Administered 2013-08-05: 1000 mL

## 2013-08-05 MED ORDER — ONDANSETRON HCL 4 MG/2ML IJ SOLN
INTRAMUSCULAR | Status: DC | PRN
  Administered 2013-08-05: 4 mg via INTRAVENOUS

## 2013-08-05 MED ORDER — ALUM & MAG HYDROXIDE-SIMETH 200-200-20 MG/5ML PO SUSP: 30.0000 mL | ORAL | Status: DC | PRN

## 2013-08-05 MED ORDER — MIDAZOLAM HCL 5 MG/5ML IJ SOLN
INTRAMUSCULAR | Status: DC | PRN
  Administered 2013-08-05: 2 mg via INTRAVENOUS

## 2013-08-05 MED ORDER — PROMETHAZINE HCL 25 MG/ML IJ SOLN: 6.2500 mg | INTRAMUSCULAR | Status: DC | PRN

## 2013-08-05 MED ORDER — ESTRADIOL 1 MG PO TABS
1.0000 mg | ORAL_TABLET | Freq: Every day | ORAL | Status: DC
  Administered 2013-08-06 – 2013-08-07 (×2): 1 mg via ORAL
  Filled 2013-08-05 (×2): qty 1

## 2013-08-05 MED ORDER — CEFAZOLIN SODIUM-DEXTROSE 2-3 GM-% IV SOLR
2.0000 g | INTRAVENOUS | Status: AC
  Administered 2013-08-05: 2 g via INTRAVENOUS

## 2013-08-05 MED ORDER — HYDROMORPHONE HCL PF 1 MG/ML IJ SOLN
0.2500 mg | INTRAMUSCULAR | Status: DC | PRN
  Administered 2013-08-05 (×2): 0.25 mg via INTRAVENOUS
  Administered 2013-08-05: 0.5 mg via INTRAVENOUS

## 2013-08-05 MED ORDER — PHENOL 1.4 % MT LIQD: 1.0000 | OROMUCOSAL | Status: DC | PRN

## 2013-08-05 MED ORDER — MENTHOL 3 MG MT LOZG
1.0000 | LOZENGE | OROMUCOSAL | Status: DC | PRN
  Administered 2013-08-06: 3 mg via ORAL
  Filled 2013-08-05: qty 9

## 2013-08-05 MED ORDER — PANTOPRAZOLE SODIUM 40 MG PO TBEC
80.0000 mg | DELAYED_RELEASE_TABLET | Freq: Every day | ORAL | Status: DC
  Administered 2013-08-06 – 2013-08-07 (×2): 80 mg via ORAL
  Filled 2013-08-05 (×2): qty 2

## 2013-08-05 MED ORDER — GLYCOPYRROLATE 0.2 MG/ML IJ SOLN
INTRAMUSCULAR | Status: DC | PRN
  Administered 2013-08-05: .4 mg via INTRAVENOUS

## 2013-08-05 MED ORDER — SODIUM CHLORIDE 0.9 % IV SOLN
INTRAVENOUS | Status: DC
  Administered 2013-08-05: 20:00:00 via INTRAVENOUS
  Administered 2013-08-05: 1000 mL via INTRAVENOUS
  Administered 2013-08-06: 04:00:00 via INTRAVENOUS

## 2013-08-05 MED ORDER — DEXAMETHASONE SODIUM PHOSPHATE 10 MG/ML IJ SOLN
INTRAMUSCULAR | Status: DC | PRN
  Administered 2013-08-05: 10 mg via INTRAVENOUS

## 2013-08-05 MED ORDER — FERROUS SULFATE 325 (65 FE) MG PO TABS
325.0000 mg | ORAL_TABLET | Freq: Three times a day (TID) | ORAL | Status: DC
  Administered 2013-08-06 – 2013-08-07 (×5): 325 mg via ORAL
  Filled 2013-08-05 (×8): qty 1

## 2013-08-05 MED ORDER — HYDROMORPHONE HCL PF 1 MG/ML IJ SOLN
INTRAMUSCULAR | Status: AC
  Filled 2013-08-05: qty 1

## 2013-08-05 MED ORDER — CYCLOSPORINE 0.05 % OP EMUL
1.0000 [drp] | Freq: Two times a day (BID) | OPHTHALMIC | Status: DC
  Administered 2013-08-05 – 2013-08-07 (×4): 1 [drp] via OPHTHALMIC
  Filled 2013-08-05 (×6): qty 1

## 2013-08-05 MED ORDER — LORATADINE 10 MG PO TABS
10.0000 mg | ORAL_TABLET | Freq: Every day | ORAL | Status: DC
  Administered 2013-08-06 – 2013-08-07 (×2): 10 mg via ORAL
  Filled 2013-08-05 (×2): qty 1

## 2013-08-05 MED ORDER — SODIUM CHLORIDE 0.9 % IJ SOLN
INTRAMUSCULAR | Status: AC
  Filled 2013-08-05: qty 10

## 2013-08-05 MED ORDER — ACETAMINOPHEN 650 MG RE SUPP: 650.0000 mg | Freq: Four times a day (QID) | RECTAL | Status: DC | PRN

## 2013-08-05 MED ORDER — ONDANSETRON HCL 4 MG/2ML IJ SOLN
INTRAMUSCULAR | Status: AC
  Filled 2013-08-05: qty 2

## 2013-08-05 MED ORDER — SODIUM CHLORIDE 0.9 % IR SOLN
Status: DC | PRN
  Administered 2013-08-05: 1000 mL

## 2013-08-05 MED ORDER — PROPOFOL 10 MG/ML IV BOLUS
INTRAVENOUS | Status: AC
  Filled 2013-08-05: qty 20

## 2013-08-05 MED ORDER — VITAMIN D 1000 UNITS PO TABS
2000.0000 [IU] | ORAL_TABLET | Freq: Every day | ORAL | Status: DC
  Administered 2013-08-06 – 2013-08-07 (×2): 2000 [IU] via ORAL
  Filled 2013-08-05 (×2): qty 2

## 2013-08-05 MED ORDER — METOCLOPRAMIDE HCL 5 MG/ML IJ SOLN: 5.0000 mg | Freq: Three times a day (TID) | INTRAMUSCULAR | Status: DC | PRN

## 2013-08-05 MED ORDER — ONDANSETRON HCL 4 MG PO TABS: 4.0000 mg | ORAL_TABLET | Freq: Four times a day (QID) | ORAL | Status: DC | PRN

## 2013-08-05 MED ORDER — NEOSTIGMINE METHYLSULFATE 1 MG/ML IJ SOLN
INTRAMUSCULAR | Status: DC | PRN
  Administered 2013-08-05: 3 mg via INTRAVENOUS

## 2013-08-05 MED ORDER — CEFAZOLIN SODIUM-DEXTROSE 2-3 GM-% IV SOLR
INTRAVENOUS | Status: AC
  Filled 2013-08-05: qty 50

## 2013-08-05 MED ORDER — LACTATED RINGERS IV SOLN
INTRAVENOUS | Status: DC
  Administered 2013-08-05: 13:00:00 via INTRAVENOUS
  Administered 2013-08-05: 1000 mL via INTRAVENOUS

## 2013-08-05 MED ORDER — KETAMINE HCL 10 MG/ML IJ SOLN
INTRAMUSCULAR | Status: DC | PRN
  Administered 2013-08-05: 10 mg via INTRAVENOUS
  Administered 2013-08-05: 20 mg via INTRAVENOUS

## 2013-08-05 MED ORDER — METOCLOPRAMIDE HCL 10 MG PO TABS: 5.0000 mg | ORAL_TABLET | Freq: Three times a day (TID) | ORAL | Status: DC | PRN

## 2013-08-05 MED ORDER — SUCCINYLCHOLINE CHLORIDE 20 MG/ML IJ SOLN
INTRAMUSCULAR | Status: DC | PRN
  Administered 2013-08-05: 60 mg via INTRAVENOUS

## 2013-08-05 SURGICAL SUPPLY — 40 items
BAG ZIPLOCK 12X15 (MISCELLANEOUS) IMPLANT
BENZOIN TINCTURE PRP APPL 2/3 (GAUZE/BANDAGES/DRESSINGS) ×3 IMPLANT
BLADE SAW SGTL 18X1.27X75 (BLADE) ×2 IMPLANT
BLADE SAW SGTL 18X1.27X75MM (BLADE) ×1
CAPT HIP PF COP ×3 IMPLANT
CELLS DAT CNTRL 66122 CELL SVR (MISCELLANEOUS) ×1 IMPLANT
CLOSURE WOUND 1/2 X4 (GAUZE/BANDAGES/DRESSINGS) ×1
DRAPE C-ARM 42X120 X-RAY (DRAPES) ×3 IMPLANT
DRAPE STERI IOBAN 125X83 (DRAPES) ×3 IMPLANT
DRAPE U-SHAPE 47X51 STRL (DRAPES) ×9 IMPLANT
DRSG AQUACEL AG ADV 3.5X10 (GAUZE/BANDAGES/DRESSINGS) ×3 IMPLANT
DURAPREP 26ML APPLICATOR (WOUND CARE) ×3 IMPLANT
ELECT BLADE TIP CTD 4 INCH (ELECTRODE) ×3 IMPLANT
ELECT REM PT RETURN 9FT ADLT (ELECTROSURGICAL) ×3
ELECTRODE REM PT RTRN 9FT ADLT (ELECTROSURGICAL) ×1 IMPLANT
FACESHIELD LNG OPTICON STERILE (SAFETY) ×12 IMPLANT
GAUZE XEROFORM 5X9 LF (GAUZE/BANDAGES/DRESSINGS) IMPLANT
GLOVE BIO SURGEON STRL SZ7.5 (GLOVE) ×3 IMPLANT
GLOVE BIOGEL PI IND STRL 8 (GLOVE) ×2 IMPLANT
GLOVE BIOGEL PI INDICATOR 8 (GLOVE) ×4
GLOVE ECLIPSE 8.0 STRL XLNG CF (GLOVE) ×3 IMPLANT
GOWN STRL REUS W/TWL XL LVL3 (GOWN DISPOSABLE) ×6 IMPLANT
HANDPIECE INTERPULSE COAX TIP (DISPOSABLE) ×2
KIT BASIN OR (CUSTOM PROCEDURE TRAY) ×3 IMPLANT
PACK TOTAL JOINT (CUSTOM PROCEDURE TRAY) ×3 IMPLANT
PADDING CAST COTTON 6X4 STRL (CAST SUPPLIES) ×3 IMPLANT
RTRCTR WOUND ALEXIS 18CM MED (MISCELLANEOUS) ×3
SET HNDPC FAN SPRY TIP SCT (DISPOSABLE) ×1 IMPLANT
STAPLER VISISTAT 35W (STAPLE) IMPLANT
STRIP CLOSURE SKIN 1/2X4 (GAUZE/BANDAGES/DRESSINGS) ×2 IMPLANT
SUT ETHIBOND NAB CT1 #1 30IN (SUTURE) ×3 IMPLANT
SUT ETHILON 3 0 PS 1 (SUTURE) IMPLANT
SUT MNCRL AB 4-0 PS2 18 (SUTURE) ×3 IMPLANT
SUT VIC AB 0 CT1 36 (SUTURE) ×3 IMPLANT
SUT VIC AB 1 CT1 36 (SUTURE) ×3 IMPLANT
SUT VIC AB 2-0 CT1 27 (SUTURE) ×2
SUT VIC AB 2-0 CT1 TAPERPNT 27 (SUTURE) ×1 IMPLANT
TOWEL OR 17X26 10 PK STRL BLUE (TOWEL DISPOSABLE) ×3 IMPLANT
TOWEL OR NON WOVEN STRL DISP B (DISPOSABLE) ×3 IMPLANT
TRAY FOLEY CATH 14FRSI W/METER (CATHETERS) IMPLANT

## 2013-08-05 NOTE — H&P (Signed)
TOTAL HIP ADMISSION H&P  Patient is admitted for left total hip arthroplasty.  Subjective:  Chief Complaint: left hip pain  HPI: [name], 68 y.o. female, has a history of pain and functional disability in the left hip(s) due to arthritis and patient has failed non-surgical conservative treatments for greater than 12 weeks to include NSAID's and/or analgesics, corticosteriod injections, supervised PT with diminished ADL's post treatment, use of assistive devices and activity modification.  Onset of symptoms was gradual starting 2 years ago with gradually worsening course since that time.The patient noted no past surgery on the left hip(s).  Patient currently rates pain in the left hip at 7 out of 10 with activity. Patient has night pain, worsening of pain with activity and weight bearing, trendelenberg gait, pain that interfers with activities of daily living and pain with passive range of motion. Patient has evidence of subchondral cysts, subchondral sclerosis, periarticular osteophytes and joint space narrowing by imaging studies. This condition presents safety issues increasing the risk of falls.  There is no current active infection.  Patient Active Problem List   Diagnosis Date Noted  . Arthritis of left hip 08/05/2013  . Closed fracture of left distal radius 05/24/2013  . Distal radius fracture 05/24/2013  . Degenerative arthritis of hip 09/17/2012  . CONSTIPATION 05/30/2009  . FLATULENCE-GAS-BLOATING 05/30/2009  . INTERNAL HEMORRHOIDS 05/24/2009  . GERD 05/24/2009  . HIATAL HERNIA 05/24/2009  . IRRITABLE BOWEL SYNDROME 05/24/2009  . OSTEOARTHRITIS 05/24/2009   Past Medical History  Diagnosis Date  . Arthritis   . Osteopenia   . GERD (gastroesophageal reflux disease)   . H/O hiatal hernia   . Anxiety     Past Surgical History  Procedure Laterality Date  . Laparoscopy      MANY YRS AGO  . Abdominal hysterectomy  2001  . Total hip arthroplasty Right 09/17/2012   Procedure: RIGHT TOTAL HIP ARTHROPLASTY ANTERIOR APPROACH;  Surgeon: Kathryne Hitchhristopher Y Everett Ricciardelli, MD;  Location: [facility];  Service: Orthopedics;  Laterality: Right;  . Colonoscopy    . Tubal ligation    . Open reduction internal fixation (orif) distal radial fracture Left 05/24/2013    Procedure: OPEN REDUCTION INTERNAL FIXATION (ORIF) LEFT DISTAL RADIUS FRACTURE;  Surgeon: Kathryne Hitchhristopher Y Missey Hasley, MD;  Location: [facility];  Service: Orthopedics;  Laterality: Left;    No prescriptions prior to admission   Allergies  Allergen Reactions  . Codeine Nausea Only    REACTION: gi upset    History  Substance Use Topics  . Smoking status: Former Smoker    Quit date: 06/12/2012  . Smokeless tobacco: Never Used  . Alcohol Use: Yes     Comment: SOCIAL    No family history on file.   Review of Systems  Musculoskeletal: Positive for joint pain.  All other systems reviewed and are negative.    Objective:  Physical Exam  Constitutional: She is oriented to person, place, and time. She appears well-developed and well-nourished.  HENT:  Head: Normocephalic and atraumatic.  Eyes: EOM are normal. Pupils are equal, round, and reactive to light.  Neck: Normal range of motion. Neck supple.  Cardiovascular: Normal rate and regular rhythm.   Respiratory: Effort normal and breath sounds normal.  GI: Soft. Bowel sounds are normal.  Musculoskeletal:       Left hip: She exhibits decreased range of motion, decreased strength and bony tenderness.  Neurological: She is alert and oriented to person, place, and time.  Skin: Skin is warm and dry.  Psychiatric: She has a normal mood and affect.    Vital signs in last 24 hours:    Labs:   Estimated body mass index is 23.72 kg/(m^2) as calculated from the following:   Height as of 05/24/13: 5\' 4"  (1.626 m).   Weight as of 05/24/13: 62.7 kg (138 lb 3.7 oz).   Imaging Review Plain radiographs demonstrate severe degenerative joint disease of the left hip(s). The  bone quality appears to be good for age and reported activity level.  Assessment/Plan:  End stage arthritis, left hip(s)  The patient history, physical examination, clinical judgement of the provider and imaging studies are consistent with end stage degenerative joint disease of the left hip(s) and total hip arthroplasty is deemed medically necessary. The treatment options including medical management, injection therapy, arthroscopy and arthroplasty were discussed at length. The risks and benefits of total hip arthroplasty were presented and reviewed. The risks due to aseptic loosening, infection, stiffness, dislocation/subluxation,  thromboembolic complications and other imponderables were discussed.  The patient acknowledged the explanation, agreed to proceed with the plan and consent was signed. Patient is being admitted for inpatient treatment for surgery, pain control, PT, OT, prophylactic antibiotics, VTE prophylaxis, progressive ambulation and ADL's and discharge planning.The patient is planning to be discharged home with home health services

## 2013-08-05 NOTE — Brief Op Note (Signed)
08/05/2013  1:48 PM  PATIENT:  Cherlynn PerchesLinda C Peavy  68 y.o. female  PRE-OPERATIVE DIAGNOSIS:  Osteoarthritis left hip  POST-OPERATIVE DIAGNOSIS:  Osteoarthritis left hip  PROCEDURE:  Procedure(s): LEFT TOTAL HIP ARTHROPLASTY ANTERIOR APPROACH (Left)  SURGEON:  Surgeon(s) and Role:    * Kathryne Hitchhristopher Y Davinder Haff, MD - Primary  PHYSICIAN ASSISTANT: Rexene EdisonGil Clark, PA-C  ANESTHESIA:   general  EBL:  Total I/O In: 1000 [I.V.:1000] Out: 675 [Urine:225; Blood:450]  BLOOD ADMINISTERED:none  DRAINS: none   LOCAL MEDICATIONS USED:  NONE  SPECIMEN:  No Specimen  DISPOSITION OF SPECIMEN:  N/A  COUNTS:  YES  TOURNIQUET:  * No tourniquets in log *  DICTATION: .Other Dictation: Dictation Number [id number]  PLAN OF CARE: Admit to inpatient   PATIENT DISPOSITION:  PACU - hemodynamically stable.   Delay start of Pharmacological VTE agent (>24hrs) due to surgical blood loss or risk of bleeding: no

## 2013-08-05 NOTE — Anesthesia Preprocedure Evaluation (Signed)
Anesthesia Evaluation  Patient identified by MRN, date of birth, ID band Patient awake    Reviewed: Allergy & Precautions, H&P , NPO status , Patient's Chart, lab work & pertinent test results  Airway Mallampati: II TM Distance: >3 FB Neck ROM: Full    Dental no notable dental hx.    Pulmonary neg pulmonary ROS, former smoker,  breath sounds clear to auscultation  Pulmonary exam normal       Cardiovascular Exercise Tolerance: Good negative cardio ROS  Rhythm:Regular Rate:Normal     Neuro/Psych Anxiety  Neuromuscular disease    GI/Hepatic Neg liver ROS, hiatal hernia, GERD-  Medicated,  Endo/Other  negative endocrine ROS  Renal/GU negative Renal ROS  negative genitourinary   Musculoskeletal negative musculoskeletal ROS (+)   Abdominal   Peds negative pediatric ROS (+)  Hematology negative hematology ROS (+)   Anesthesia Other Findings   Reproductive/Obstetrics negative OB ROS                           Anesthesia Physical Anesthesia Plan  ASA: II  Anesthesia Plan: General   Post-op Pain Management:    Induction: Intravenous  Airway Management Planned:   Additional Equipment:   Intra-op Plan:   Post-operative Plan: Extubation in OR  Informed Consent: I have reviewed the patients History and Physical, chart, labs and discussed the procedure including the risks, benefits and alternatives for the proposed anesthesia with the patient or authorized representative who has indicated his/her understanding and acceptance.   Dental advisory given  Plan Discussed with: CRNA  Anesthesia Plan Comments: (Discussed r/b general versus spinal. Patient had a general for the other hip last year and prefers a general today.)        Anesthesia Quick Evaluation

## 2013-08-05 NOTE — Preoperative (Signed)
Beta Blockers   Reason not to administer Beta Blockers:Not Applicable 

## 2013-08-05 NOTE — Transfer of Care (Signed)
Immediate Anesthesia Transfer of Care Note  Patient: Jocelyn PerchesLinda C Peterkin  Procedure(s) Performed: Procedure(s): LEFT TOTAL HIP ARTHROPLASTY ANTERIOR APPROACH (Left)  Patient Location: PACU  Anesthesia Type:General  Level of Consciousness: awake and oriented  Airway & Oxygen Therapy: Patient Spontanous Breathing and Patient connected to face mask oxygen  Post-op Assessment: Report given to PACU RN and Post -op Vital signs reviewed and stable  Post vital signs: Reviewed and stable  Complications: No apparent anesthesia complications

## 2013-08-05 NOTE — Anesthesia Postprocedure Evaluation (Signed)
  Anesthesia Post-op Note  Patient: Jocelyn PerchesLinda C [name]  Procedure(s) Performed: Procedure(s) (LRB): LEFT TOTAL HIP ARTHROPLASTY ANTERIOR APPROACH (Left)  Patient Location: PACU  Anesthesia Type: General  Level of Consciousness: awake and alert   Airway and Oxygen Therapy: Patient Spontanous Breathing  Post-op Pain: mild  Post-op Assessment: Post-op Vital signs reviewed, Patient's Cardiovascular Status Stable, Respiratory Function Stable, Patent Airway and No signs of Nausea or vomiting  Last Vitals:  Filed Vitals:   08/05/13 1500  BP: 117/66  Pulse: 85  Temp:   Resp: 12    Post-op Vital Signs: stable   Complications: No apparent anesthesia complications

## 2013-08-05 NOTE — Progress Notes (Signed)
Utilization review completed.  

## 2013-08-05 NOTE — Anesthesia Procedure Notes (Signed)
Procedure Name: Intubation Date/Time: 08/05/2013 12:33 PM Performed by: Leroy LibmanEARDON, Jocelyn Zeiser L Patient Re-evaluated:Patient Re-evaluated prior to inductionOxygen Delivery Method: Circle system utilized Preoxygenation: Pre-oxygenation with 100% oxygen Intubation Type: IV induction Ventilation: Mask ventilation without difficulty and Oral airway inserted - appropriate to patient size Laryngoscope Size: Miller and 2 Grade View: Grade II Tube type: Oral Tube size: 7.5 mm Number of attempts: 1 Airway Equipment and Method: Stylet Secured at: 20 cm Tube secured with: Tape Dental Injury: Teeth and Oropharynx as per pre-operative assessment

## 2013-08-06 LAB — CBC
HEMATOCRIT: 31.3 % — AB (ref 36.0–46.0)
Hemoglobin: 10.2 g/dL — ABNORMAL LOW (ref 12.0–15.0)
MCH: 31.5 pg (ref 26.0–34.0)
MCHC: 32.6 g/dL (ref 30.0–36.0)
MCV: 96.6 fL (ref 78.0–100.0)
Platelets: 168 10*3/uL (ref 150–400)
RBC: 3.24 MIL/uL — ABNORMAL LOW (ref 3.87–5.11)
RDW: 12.7 % (ref 11.5–15.5)
WBC: 11.3 10*3/uL — ABNORMAL HIGH (ref 4.0–10.5)

## 2013-08-06 LAB — BASIC METABOLIC PANEL
BUN: 13 mg/dL (ref 6–23)
CO2: 26 mEq/L (ref 19–32)
Calcium: 8.4 mg/dL (ref 8.4–10.5)
Chloride: 105 mEq/L (ref 96–112)
Creatinine, Ser: 0.51 mg/dL (ref 0.50–1.10)
GFR calc Af Amer: >90 mL/min (ref >90)
Glucose, Bld: 168 mg/dL — ABNORMAL HIGH (ref 70–99)
Potassium: 4.7 mEq/L (ref 3.7–5.3)
SODIUM: 141 meq/L (ref 137–147)

## 2013-08-06 NOTE — Op Note (Signed)
NAMSabino Donovan:  Shahan, Courtney                ACCOUNT NO.:  [id number]  MEDICAL RECORD NO.:  [id number]  LOCATION:  1609                         [facility]  PHYSICIAN:  Vanita PandaChristopher Y. Magnus IvanBlackman, M.D.DATE OF BIRTH:  July 14, 1945  DATE OF PROCEDURE:  08/05/2013 DATE OF DISCHARGE:                              OPERATIVE REPORT   PREOPERATIVE DIAGNOSES:  Severe end-stage arthritis and degenerative joint disease, left hip.  POSTOPERATIVE DIAGNOSES:  Severe end-stage arthritis and degenerative joint disease, left hip.  PROCEDURE:  Left total hip arthroplasty through direct anterior approach.  IMPLANTS:  DePuy Sector Gription acetabular component size 48, 1 screw and apex hole eliminator guide, size 32+ 4 neutral polyethylene liner, size 11 Corail femoral component with standard offset, size 32+ 1 ceramic hip ball.  SURGEON:  Vanita PandaChristopher Y. Magnus IvanBlackman, M.D.  ASSISTANT:  Richardean CanalGilbert Clark, P.A.  ANESTHESIA:  General.  BLOOD LOSS:  Less than 500 mL.  COMPLICATIONS:  None.  INDICATIONS:  Ms. Judith PartCauley is a very pleasant 68 year old female, well known to me.  She has been plagued by debilitating arthritis involving both of her hips.  Last year, she underwent successful right total hip arthroplasty through direct anterior approach.  Due to the severe pain in her left hip, her decreased mobility, her daily pain as well as her decreased quality of life, she wishes now to proceed with left total hip arthroplasty through direct anterior approach.  X-rays show evidence of joint space narrowing, subchondral sclerosis, cystic changes and periarticular osteophytes.  Given the failure of conservative treatment, she wished to proceed with the same surgery on the left side now.  PROCEDURE DESCRIPTION:  After informed consent was obtained, appropriate left leg was marked.  She was brought to the operating room, and while she was on her stretcher, general anesthesia was obtained and a Foley catheter was  placed.  Both feet had traction boots applied to them and next, she was placed supine on the Hana fracture table with the perineal post in place and both legs in inline skeletal traction devices, but no traction applied.  Her left operative hip was then prepped and draped with DuraPrep and sterile drapes.  A time-out was called to identify the correct patient and correct left hip.  I then made an incision inferior and posterior to the anterior superior iliac spine and carried this obliquely down the leg.  We dissected down to the tensor fascia lata muscle.  The tensor fascia was then divided longitudinally, so we could proceed with a direct anterior approach to the hip.  We cauterized the lateral femoral circumflex vessels and then placed the Cobra retractors around the medial and lateral femoral neck.  I then opened up the hip capsule in a L-type format and found a large joint effusion and significant osteophytes all around the femoral neck.  I placed the Cobra retractors within the joint capsule and then made my femoral neck cut with an oscillating saw just proximal to the lesser trochanter and I completed this with an osteotome.  I then placed a corkscrew guide in the femoral head and removed the femoral head in its entirety and found it to be devoid of cartilage.  I then  cleaned the acetabulum of debris including remnants of the acetabular labrum.  I placed a bent Hohmann medially and a Cobra retractor laterally.  I then began reaming from a size 42 reamer and 2-mm increments up to a size 48.  This matched her other side.  I then placed the real DePuy Sector Gription acetabular component size 48 and I decided to put a single screw on this side.  I then placed the apex hole eliminator guide and the 32+ 4 neutral polyethylene liner.  Attention was then turned to the femur.  With the leg externally rotated to 100 degrees, extended and adducted, I was able to place a Mueller retractor  medially and a Hohmann retractor behind the greater trochanter.  I released the lateral joint capsule and then used a box cutting guide to open up the femoral canal and a rongeur to lateralize, began broaching from the size 8 broach using the Corail broaching system from DePuy up to the size 11.  With the size 11, I trialed a standard neck and a 32+ 1 hip ball, which matched her other side as well.  We brought the leg back up and over traction, internal rotation, reducing the pelvis, it was stable past 95 degrees of external rotation and 45 degrees of internal rotation and had minimal shuck, and her leg lengths were measured equal under direct fluoroscopy.  Her offsets were also equal.  We then dislocated the hip and removed the trial components and placed the real size 11 Corail femoral component with standard offset and the real 32+ 1 ceramic hip ball and reduced this back in the acetabulum and it was stable.  We then copiously irrigated the soft tissue with normal saline solution using pulsatile lavage.  We closed the joint capsule with interrupted #1 Ethibond suture followed by running #1 Vicryl in the tensor fascia, 0 Vicryl in the deep tissue, 2-0 Vicryl in the subcutaneous tissue, 4-0 Monocryl subcuticular stitch and Steri-Strips on the skin.  An Aquacel dressing was applied as well.  She was then taken off the Hana table, awakened, extubated and taken to the recovery room in stable condition.  All final counts were correct.  There were no complications noted.  Of note, Richardean Canal, PA- C was present in the entire case and his presence was crucial for facilitating this case from beginning to end and layered closure of the wound.     Vanita Panda. Magnus Ivan, M.D.     CYB/MEDQ  D:  08/05/2013  T:  08/06/2013  Job:  [id number]

## 2013-08-06 NOTE — Progress Notes (Signed)
OT Cancellation Note  Patient Details Name: Cherlynn PerchesLinda C Gudger MRN: [id number] DOB: 08/11/1945   Cancelled Treatment:    Reason Eval/Treat Not Completed: OT screened, no needs identified, will sign off (familiar with all ADL techniques from recent THR 2 mo ago)  Amram Maya A 08/06/2013, 10:42 AM

## 2013-08-06 NOTE — Progress Notes (Signed)
Physical Therapy Treatment Note   08/06/13 1400  PT Visit Information  Last PT Received On 08/06/13  Assistance Needed +1  History of Present Illness s/p L direct anterior THR  PT Time Calculation  PT Start Time 1349  PT Stop Time 1403  PT Time Calculation (min) 14 min  Subjective Data  Subjective Pt agreeable to ambulate again in hallway.  Precautions  Precautions None  Cognition  Arousal/Alertness Awake/alert  Behavior During Therapy WFL for tasks assessed/performed  Overall Cognitive Status Within Functional Limits for tasks assessed  Bed Mobility  Overal bed mobility Modified Independent  Transfers  Overall transfer level Needs assistance  Equipment used Rolling walker (2 wheeled)  Transfers Sit to/from Stand  Sit to Stand Supervision  General transfer comment verbal cues for hand placement  Ambulation/Gait  Ambulation/Gait assistance Supervision  Ambulation Distance (Feet) 300 Feet  Assistive device Rolling walker (2 wheeled)  Gait Pattern/deviations Step-through pattern;Antalgic  General Gait Details verbal cue for increasing L hip/knee flexion  PT - End of Session  Activity Tolerance Patient tolerated treatment well  Patient left in bed;with call bell/phone within reach;with family/visitor present  PT - Assessment/Plan  PT Plan Current plan remains appropriate  PT Frequency 7X/week  Follow Up Recommendations Home health PT  PT equipment None recommended by PT  PT Goal Progression  Progress towards PT goals Progressing toward goals  PT General Charges  $$ ACUTE PT VISIT 1 Procedure  PT Treatments  $Gait Training 8-22 mins   Zenovia JarredKati Pellegrino Kennard, PT, DPT 08/06/2013 Pager: [phone]

## 2013-08-06 NOTE — Progress Notes (Signed)
Subjective: 1 Day Post-Op Procedure(s) (LRB): LEFT TOTAL HIP ARTHROPLASTY ANTERIOR APPROACH (Left) Patient reports pain as moderate.  Asymptomatic acute blood loss anemia.  Objective: Vital signs in last 24 hours: Temp:  [97.3 F (36.3 C)-98.4 F (36.9 C)] 98.3 F (36.8 C) (02/21 1002) Pulse Rate:  [81-96] 81 (02/21 1002) Resp:  [10-20] 16 (02/21 1002) BP: (98-131)/(58-86) 98/63 mmHg (02/21 1002) SpO2:  [97 %-100 %] 100 % (02/21 1002) Weight:  [65.378 kg (144 lb 2.1 oz)] 65.378 kg (144 lb 2.1 oz) (02/20 1515)  Intake/Output from previous day: 02/20 0701 - 02/21 0700 In: 3397.5 [I.V.:3347.5; IV Piggyback:50] Out: 3695 [Urine:3245; Blood:450] Intake/Output this shift: Total I/O In: 480 [P.O.:480] Out: -    Recent Labs  08/06/13 0502  HGB 10.2*    Recent Labs  08/06/13 0502  WBC 11.3*  RBC 3.24*  HCT 31.3*  PLT 168    Recent Labs  08/06/13 0502  NA 141  K 4.7  CL 105  CO2 26  BUN 13  CREATININE 0.51  GLUCOSE 168*  CALCIUM 8.4   No results found for this basename: LABPT, INR,  in the last 72 hours  Sensation intact distally Intact pulses distally Dorsiflexion/Plantar flexion intact Incision: dressing C/D/I  Assessment/Plan: 1 Day Post-Op Procedure(s) (LRB): LEFT TOTAL HIP ARTHROPLASTY ANTERIOR APPROACH (Left) Up with therapy  Assata Juncaj Y 08/06/2013, 10:31 AM

## 2013-08-06 NOTE — Evaluation (Signed)
Physical Therapy Evaluation Patient Details Name: Jocelyn PerchesLinda C [name] MRN: [id number] DOB: Aug 04, 1945 Today's Date: 08/06/2013 Time: [id number] PT Time Calculation (min): 26 min  PT Assessment / Plan / Recommendation History of Present Illness  s/p L direct anterior THR  Clinical Impression  Pt is s/p L direct anterior THA resulting in the deficits listed below (see PT Problem List).  Pt will benefit from skilled PT to increase their independence and safety with mobility to allow discharge to the venue listed below.  Pt mobilizing very well POD #1 and plans to d/c to daughter's home.      PT Assessment  Patient needs continued PT services    Follow Up Recommendations  Home health PT    Does the patient have the potential to tolerate intense rehabilitation      Barriers to Discharge        Equipment Recommendations  None recommended by PT    Recommendations for Other Services     Frequency 7X/week    Precautions / Restrictions Precautions Precautions: None   Pertinent Vitals/Pain 4/10 L hip at rest, up to 7-8/10 with mobility/exercises, RN brought pain meds      Mobility  Bed Mobility Overal bed mobility: Needs Assistance Bed Mobility: Supine to Sit Supine to sit: Supervision General bed mobility comments: verbal cues for self assist Transfers Overall transfer level: Needs assistance Equipment used: Rolling walker (2 wheeled) Transfers: Sit to/from Stand Sit to Stand: Min guard General transfer comment: verbal cues for hand placement Ambulation/Gait Ambulation/Gait assistance: Min guard Ambulation Distance (Feet): 120 Feet Assistive device: Rolling walker (2 wheeled) Gait Pattern/deviations: Antalgic;Decreased stance time - left;Step-through pattern General Gait Details: verbal cues for sequence however pt progressed to step through pattern quickly    Exercises Total Joint Exercises Ankle Circles/Pumps: AROM;Both;20 reps Quad Sets: AROM;Both;15 reps Gluteal Sets:  AROM;Both;15 reps Heel Slides: AROM;Left;15 reps Hip ABduction/ADduction: AROM;Left;15 reps Long Arc Quad: AROM;Left;15 reps;Seated Marching in Standing: AROM;Seated;Left;10 reps   PT Diagnosis:    PT Problem List: Decreased strength;Decreased mobility;Pain PT Treatment Interventions: Functional mobility training;Stair training;Gait training;DME instruction;Patient/family education;Therapeutic activities;Therapeutic exercise     PT Goals(Current goals can be found in the care plan section) Acute Rehab PT Goals PT Goal Formulation: With patient Time For Goal Achievement: 08/10/13 Potential to Achieve Goals: Good  Visit Information  Last PT Received On: 08/06/13 Assistance Needed: +1 History of Present Illness: s/p L direct anterior THR       Prior Functioning  Home Living Family/patient expects to be discharged to:: Private residence Living Arrangements:  (going to daughter on d/c) Available Help at Discharge: Family Type of Home: House Home Access: Stairs to enter Secretary/administratorntrance Stairs-Number of Steps: 2 Entrance Stairs-Rails: None Home Layout: One level Home Equipment: Environmental consultantWalker - 2 wheels;Cane - single point;Bedside commode Prior Function Level of Independence: Independent Communication Communication: No difficulties    Cognition  Cognition Arousal/Alertness: Awake/alert Behavior During Therapy: WFL for tasks assessed/performed Overall Cognitive Status: Within Functional Limits for tasks assessed    Extremity/Trunk Assessment Lower Extremity Assessment Lower Extremity Assessment: LLE deficits/detail LLE Deficits / Details: functional weakness of L hip present with mobility   Balance    End of Session PT - End of Session Activity Tolerance: Patient tolerated treatment well Patient left: with family/visitor present;in chair;with call bell/phone within reach Nurse Communication: Mobility status (RN brought meds end of session)  GP     Declan Adamson,KATHrine E 08/06/2013, 10:09  AM Zenovia JarredKati Candia Kingsbury, PT, DPT 08/06/2013 Pager: [phone]

## 2013-08-07 LAB — CBC
HEMATOCRIT: 28.9 % — AB (ref 36.0–46.0)
HEMOGLOBIN: 9.6 g/dL — AB (ref 12.0–15.0)
MCH: 32.3 pg (ref 26.0–34.0)
MCHC: 33.2 g/dL (ref 30.0–36.0)
MCV: 97.3 fL (ref 78.0–100.0)
Platelets: 145 10*3/uL — ABNORMAL LOW (ref 150–400)
RBC: 2.97 MIL/uL — ABNORMAL LOW (ref 3.87–5.11)
RDW: 13.2 % (ref 11.5–15.5)
WBC: 9.6 10*3/uL (ref 4.0–10.5)

## 2013-08-07 MED ORDER — OXYCODONE-ACETAMINOPHEN 5-325 MG PO TABS: 1.0000 | ORAL_TABLET | ORAL | Status: DC | PRN

## 2013-08-07 MED ORDER — ONDANSETRON HCL 4 MG PO TABS: 4.0000 mg | ORAL_TABLET | Freq: Three times a day (TID) | ORAL | Status: DC | PRN

## 2013-08-07 MED ORDER — ASPIRIN 325 MG PO TBEC: 325.0000 mg | DELAYED_RELEASE_TABLET | Freq: Two times a day (BID) | ORAL | Status: DC

## 2013-08-07 NOTE — Progress Notes (Signed)
   CARE MANAGEMENT NOTE 08/07/2013  Patient:  [name],[name]   Account Number:  [id number]  Date Initiated:  08/07/2013  Documentation initiated by:  [facility]Alashia [facility]  Subjective/Objective Assessment:   LEFT TOTAL HIP ARTHROPLASTY ANTERIOR APPROACH     Action/Plan:   home with dtr, [place] # [phone]   Anticipated DC Date:  08/08/2013   Anticipated DC Plan:  HOME W HOME HEALTH SERVICES      DC Planning Services  CM consult      [facility]  [facility]   Choice offered to / List presented to:  [name]-1 Patient        HH arranged  HH-2 PT      [facility]  [facility]   Status of service:  Completed, signed off Medicare Important Message given?   (If response is "NO", the following Medicare IM given date fields will be blank) Date Medicare IM given:   Date Additional Medicare IM given:    Discharge Disposition:  HOME W HOME HEALTH SERVICES  Per UR Regulation:    If discussed at Long Length of Stay Meetings, dates discussed:    Comments:  08/07/2013 1000 NCM spoke to pt and [facility] Pt [facility]operatively [facility]. States she has RW, cane and bedside commode at home. Going home with dtr. Provided contact info to [facility] [place].  Isidoro DonningAlesia Leetta Hendriks RN CCM Case Mgmt phone [phone]  [address] [place] [id number]

## 2013-08-07 NOTE — Discharge Summary (Signed)
Patient ID: [name] MRN: [id number] DOB/AGE: June 05, 1946 68 y.o.  Admit date: 08/05/2013 Discharge date: 08/07/2013  Admission Diagnoses:  Principal Problem:   Arthritis of left hip Active Problems:   Status post THR (total hip replacement)   Discharge Diagnoses:  Same  Past Medical History  Diagnosis Date  . Arthritis   . Osteopenia   . GERD (gastroesophageal reflux disease)   . H/O hiatal hernia   . Anxiety     Surgeries: Procedure(s): LEFT TOTAL HIP ARTHROPLASTY ANTERIOR APPROACH on 08/05/2013   Consultants:    Discharged Condition: Improved  Hospital Course: [name] is an 68 y.o. female who was admitted 08/05/2013 for operative treatment ofArthritis of left hip. Patient has severe unremitting pain that affects sleep, daily activities, and work/hobbies. After pre-op clearance the patient was taken to the operating room on 08/05/2013 and underwent  Procedure(s): LEFT TOTAL HIP ARTHROPLASTY ANTERIOR APPROACH.    Patient was given perioperative antibiotics: Anti-infectives   Start     Dose/Rate Route Frequency Ordered Stop   08/05/13 1830  ceFAZolin (ANCEF) IVPB 1 g/50 mL premix     1 g 100 mL/hr over 30 Minutes Intravenous Every 6 hours 08/05/13 1523 08/05/13 2342   08/05/13 0937  ceFAZolin (ANCEF) IVPB 2 g/50 mL premix     2 g 100 mL/hr over 30 Minutes Intravenous On call to O.R. 08/05/13 1191 08/05/13 1235       Patient was given sequential compression devices, early ambulation, and chemoprophylaxis to prevent DVT.  Patient benefited maximally from hospital stay and there were no complications.    Recent vital signs: Patient Vitals for the past 24 hrs:  BP Temp Temp src Pulse Resp SpO2  08/07/13 1440 108/70 mmHg 98.2 F (36.8 C) Oral 82 18 94 %  08/07/13 0652 103/69 mmHg 99.4 F (37.4 C) Oral 93 20 93 %  08/07/13 0500 93/59 mmHg 100.2 F (37.9 C) Oral 103 20 93 %  08/07/13 0400 - - - - 18 93 %  08/07/13 0000 - - - - 20 94 %  08/06/13 2100  107/69 mmHg 99.2 F (37.3 C) Oral 96 18 96 %     Recent laboratory studies:  Recent Labs  08/06/13 0502 08/07/13 0520  WBC 11.3* 9.6  HGB 10.2* 9.6*  HCT 31.3* 28.9*  PLT 168 145*  NA 141  --   K 4.7  --   CL 105  --   CO2 26  --   BUN 13  --   CREATININE 0.51  --   GLUCOSE 168*  --   CALCIUM 8.4  --      Discharge Medications:     Medication List    STOP taking these medications       naproxen sodium 220 MG tablet  Commonly known as:  ANAPROX      TAKE these medications       ALPRAZolam 0.25 MG tablet  Commonly known as:  XANAX  Take 0.25 mg by mouth daily with lunch.     aspirin 325 MG EC tablet  Take 1 tablet (325 mg total) by mouth 2 (two) times daily after a meal.     celecoxib 200 MG capsule  Commonly known as:  CELEBREX  Take 200 mg by mouth daily.     cetirizine 10 MG tablet  Commonly known as:  ZYRTEC  Take 10 mg by mouth daily.     cycloSPORINE 0.05 % ophthalmic emulsion  Commonly known as:  RESTASIS  Place 1 drop into both eyes 2 (two) times daily.     esomeprazole 40 MG capsule  Commonly known as:  NEXIUM  Take 40 mg by mouth daily at 12 noon.     estradiol 1 MG tablet  Commonly known as:  ESTRACE  Take 1 mg by mouth daily.     MUCUS RELIEF ADULT PO  Take 1 tablet by mouth daily.     multivitamin with minerals Tabs tablet  Take 1 tablet by mouth daily.     ondansetron 4 MG tablet  Commonly known as:  ZOFRAN  Take 1 tablet (4 mg total) by mouth every 8 (eight) hours as needed for nausea or vomiting.     oxyCODONE-acetaminophen 5-325 MG per tablet  Commonly known as:  PERCOCET/ROXICET  Take 1-2 tablets by mouth every 4 (four) hours as needed for severe pain.     polyethylene glycol packet  Commonly known as:  MIRALAX / GLYCOLAX  Take 17 g by mouth daily.     Vitamin D 2000 UNITS tablet  Take 2,000 Units by mouth daily.        Diagnostic Studies: Dg Hip Complete Left  08/05/2013   CLINICAL DATA:  Post left total hip  replacement  EXAM: DG C-ARM 1-60 MIN - NRPT [facility]; LEFT HIP - COMPLETE 2+ VIEW  COMPARISON:  DG PELVIS PORTABLE dated 09/17/2012  FLUOROSCOPY TIME:  19 seconds  FINDINGS: Two spot intraoperative fluoroscopic images of the left hip and lower pelvis provided for review.  Images demonstrate the sequela of interval left total hip replacement. Alignment appears anatomic on this solitary AP projection. No definite fracture. Subcutaneous emphysema about the operative site. No radiopaque foreign body.  Stable sequela of contralateral right total hip replacement, incompletely imaged.  IMPRESSION: Post left total hip replacement without definite evidence of complication.   Electronically Signed   By: Simonne ComeJohn  Watts M.D.   On: 08/05/2013 13:53   Dg Pelvis Portable  08/05/2013   CLINICAL DATA:  Post left hip replacement  EXAM: PORTABLE PELVIS 1-2 VIEWS  COMPARISON:  Portable exam 1414 hr compared to intraoperative images of 08/05/2013  FINDINGS: Bilateral hip prostheses identified in expected positions.  Bones appear mildly demineralized.  Expected postsurgical soft tissue changes at left hip region.  No acute fracture, dislocation, or bone destruction identified on single AP view.  Superior aspect of pelvis excluded.  IMPRESSION: New left hip prosthesis without acute complication.   Electronically Signed   By: Ulyses SouthwardMark  Boles M.D.   On: 08/05/2013 14:25   Dg Hip Portable 1 View Left  08/05/2013   CLINICAL DATA:  Post left hip replacement  EXAM: PORTABLE LEFT HIP - 1 VIEW  COMPARISON:  Portable cross-table lateral view 1416 hr compared intraoperative images of 10/03/2013  FINDINGS: Bilateral hip prostheses identified.  New left hip prosthesis is in expected position without fracture or dislocation.  IMPRESSION: Left hip prosthesis without acute complication.  Old right hip arthroplasty.   Electronically Signed   By: Ulyses SouthwardMark  Boles M.D.   On: 08/05/2013 14:26   Dg C-arm 1-60 Min-no Report  08/05/2013   CLINICAL DATA:  Post left  total hip replacement  EXAM: DG C-ARM 1-60 MIN - [facility]; LEFT HIP - COMPLETE 2+ VIEW  COMPARISON:  DG PELVIS PORTABLE dated 09/17/2012  FLUOROSCOPY TIME:  19 seconds  FINDINGS: Two spot intraoperative fluoroscopic images of the left hip and lower pelvis provided for review.  Images demonstrate the sequela of interval left total hip replacement. Alignment  appears anatomic on this solitary AP projection. No definite fracture. Subcutaneous emphysema about the operative site. No radiopaque foreign body.  Stable sequela of contralateral right total hip replacement, incompletely imaged.  IMPRESSION: Post left total hip replacement without definite evidence of complication.   Electronically Signed   By: Simonne Come M.D.   On: 08/05/2013 13:53    Disposition: 01-Home or Self Care      Discharge Orders   Future Orders Complete By Expires   Discharge wound care:  As directed    Comments:     Keep dressing clean and intact. May shower with dressing intact . Remove dressing Thursday and shower. After showering apply clean dressing.   Weight bearing as tolerated  As directed    Questions:     Laterality:     Extremity:        Follow-up Information   Follow up with Kathryne Hitch, MD. Schedule an appointment as soon as possible for a visit in 2 weeks.   Specialty:  Orthopedic Surgery   Contact information:   [address] [place] [address] [phone]        Signed: Kathryne Hitch 08/07/2013, 4:18 PM

## 2013-08-07 NOTE — Progress Notes (Signed)
Physical Therapy Treatment Patient Details Name: [name] MRN: [id number] DOB: 06/11/1946 Today's Date: 08/07/2013 Time: 7829-56211148-1220 PT Time Calculation (min): 32 min  PT Assessment / Plan / Recommendation  History of Present Illness s/p L direct anterior THR   PT Comments   POD # 2 am session.  Pt stated she had a really "rough night" with pain and very "little sleep".  Assisted pt OOB to amb to BR top void then in hallway.  Pt required increased time.  Assisted back to bed to perform THR TE's mostly AAROM due to pain.  ICE applied.    Follow Up Recommendations  Home health PT(daughters home)     Does the patient have the potential to tolerate intense rehabilitation     Barriers to Discharge        Equipment Recommendations  None recommended by PT (has all from prior )    Recommendations for Other Services    Frequency 7X/week   Progress towards PT Goals Progress towards PT goals: Progressing toward goals  Plan      Precautions / Restrictions Precautions Precautions: None Restrictions Weight Bearing Restrictions: No   Pertinent Vitals/Pain C/o 7/10 during TE's C/o 4/10 after session ICE applied Tramadol requested    Mobility  Bed Mobility Overal bed mobility: Needs Assistance Bed Mobility: Supine to Sit;Sit to Supine Supine to sit: Min guard Sit to supine: Min guard General bed mobility comments: minguard assist for L LE only due to increased c/o pain this morning Transfers Overall transfer level: Needs assistance Equipment used: Rolling walker (2 wheeled) Transfers: Sit to/from Stand Sit to Stand: Supervision General transfer comment: increased time due to increased c/o pain this korning Ambulation/Gait Ambulation/Gait assistance: Supervision;Min guard Ambulation Distance (Feet): 175 Feet Assistive device: Rolling walker (2 wheeled) Gait Pattern/deviations: Step-through pattern;Decreased stride length Gait velocity: decreased General Gait Details:  increased time due to increased c/o pain this morning and a "really rough night" stated pt.     Exercises Total Hip Replacement TE's 10 reps ankle pumps 10 reps knee presses 10 reps heel slides 10 reps SAQ's 10 reps ABD Followed by ICE    PT Goals (current goals can now be found in the care plan section)    Visit Information  Last PT Received On: 08/07/13 Assistance Needed: +1 History of Present Illness: s/p L direct anterior THR    Subjective Data      Cognition       Balance     End of Session PT - End of Session Equipment Utilized During Treatment: Gait belt Activity Tolerance: Patient limited by pain Patient left: in bed;with call bell/phone within reach;with family/visitor present   Jocelyn ShellingLori [name]  PTA [facility]  Acute  Rehab Pager      [phone]

## 2013-08-07 NOTE — Progress Notes (Signed)
Physical Therapy Treatment Patient Details Name: [name] MRN: [id number] DOB: 1946/06/15 Today's Date: 08/07/2013 Time: 1624-4695 PT Time Calculation (min): 25 min  PT Assessment / Plan / Recommendation  History of Present Illness s/p L direct anterior THR   PT Comments   POD # 2 pt feeling better with pain control.  Assisted OOB to amb in hallway with daughter, practiced stairs with daughter, then back to pt's room to use BR.  Assisted back to bed and applied ICE.  Pt has met all PT goals to D/C to home today if MD clears.   Follow Up Recommendations  Home health PT     Does the patient have the potential to tolerate intense rehabilitation     Barriers to Discharge        Equipment Recommendations  None recommended by PT    Recommendations for Other Services    Frequency 7X/week   Progress towards PT Goals Progress towards PT goals: Progressing toward goals  Plan      Precautions / Restrictions Precautions Precautions: None Restrictions Weight Bearing Restrictions: No    Pertinent Vitals/Pain C/o 5/10 pain with act meds requested ICE applied    Mobility  Bed Mobility Overal bed mobility: Needs Assistance Bed Mobility: Supine to Sit;Sit to Supine Supine to sit: Supervision;Min guard Sit to supine: Supervision;Min guard General bed mobility comments: L LE on/off bed plus increased time Transfers Overall transfer level: Needs assistance Equipment used: Rolling walker (2 wheeled) Transfers: Sit to/from Stand Sit to Stand: Supervision General transfer comment: increased time due to increased c/o pain this korning Ambulation/Gait Ambulation/Gait assistance: Supervision Ambulation Distance (Feet): 160 Feet Assistive device: Rolling walker (2 wheeled) Gait Pattern/deviations: Step-through pattern;Decreased stride length Gait velocity: decreased General Gait Details: increased time Stairs: Yes Stairs assistance: Min assist Stair Management: Backwards;With  walker Number of Stairs: 2 General stair comments: with daughter "[name]" present for education/demonstartion     PT Goals (current goals can now be found in the care plan section)    Visit Information  Last PT Received On: 08/07/13 Assistance Needed: +1 History of Present Illness: s/p L direct anterior THR    Subjective Data      Cognition       Balance     End of Session PT - End of Session Equipment Utilized During Treatment: Gait belt Activity Tolerance: Patient limited by pain;Patient tolerated treatment well Patient left: in bed;with call bell/phone within reach;with family/visitor present   Rica Koyanagi  PTA [facility]  Acute  Rehab Pager      [phone]

## 2014-06-10 IMAGING — CR DG HIP 1V PORT*R*
1 series · 1 of 1 positions shown · non-contrast
Comparison: 09/17/2012 portable hip

CLINICAL DATA: Postop right hip anterior approach.

PORTABLE RIGHT HIP - 1 VIEW

[AP]
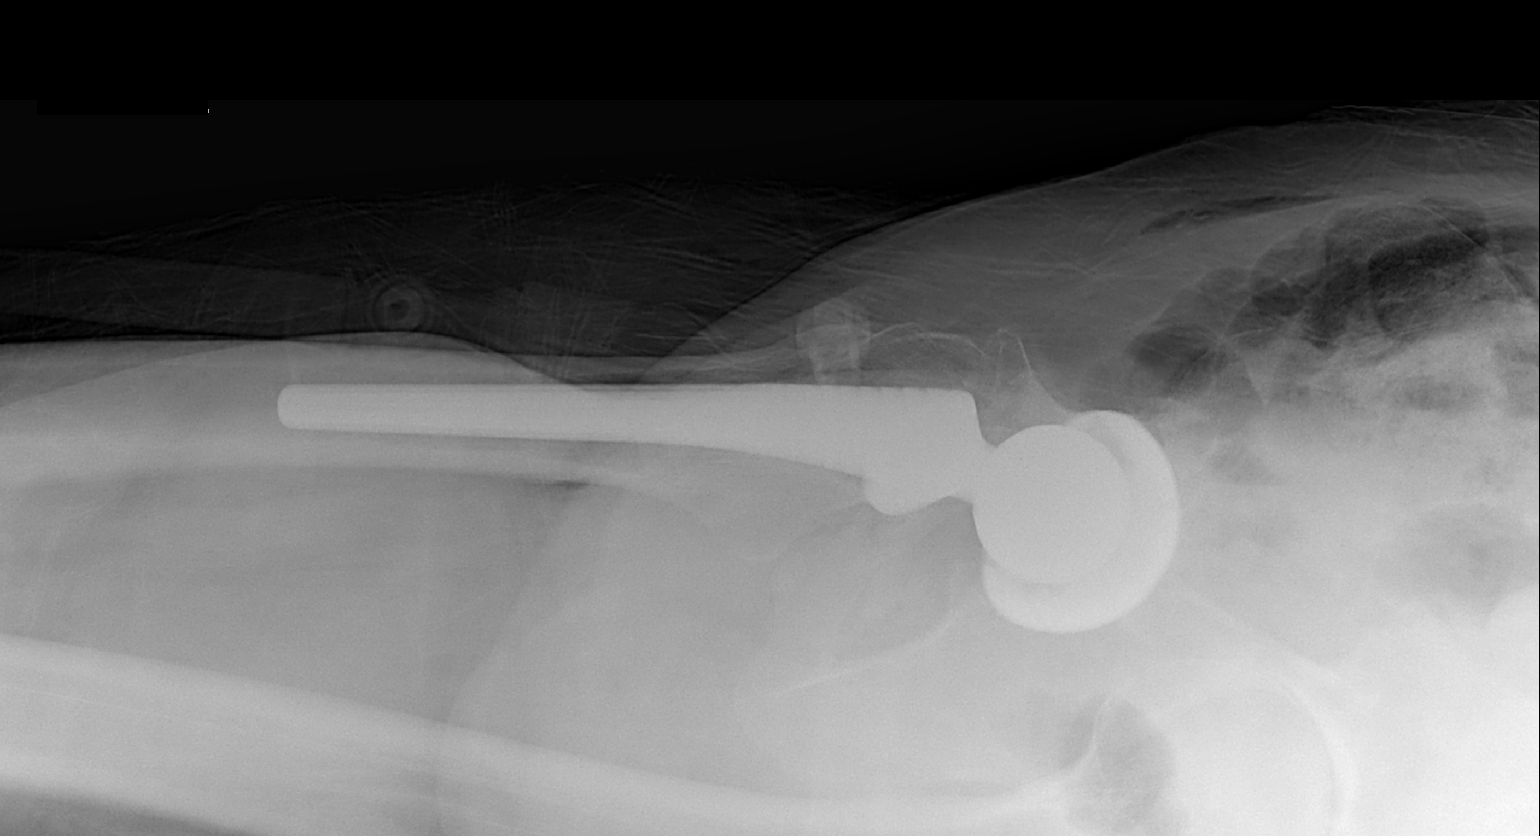

[1 of 1 positions shown; findings below may reference images not displayed]

FINDINGS: Cross-table lateral view is performed, showing a
hemiarthroplasty.  Hardware is located.  No evidence for acute
fracture.  Postoperative gas is noted in the soft tissues.
IMPRESSION: No evidence for dislocation.

## 2015-01-08 ENCOUNTER — Encounter: Payer: Self-pay | Admitting: Internal Medicine

## 2015-04-28 IMAGING — RF DG C-ARM 1-60 MIN-NO REPORT
1 series · 2 of 2 positions shown · non-contrast
Comparison: DG PELVIS PORTABLE dated 09/17/2012

FLUOROSCOPY TIME:  19 seconds

CLINICAL DATA: Post left total hip replacement

EXAM:
DG C-ARM 1-60 MIN - [facility]PT [facility]; LEFT HIP - COMPLETE 2+ VIEW

[Series 1: run · 2 of 2 slices shown]
[im 1/2]
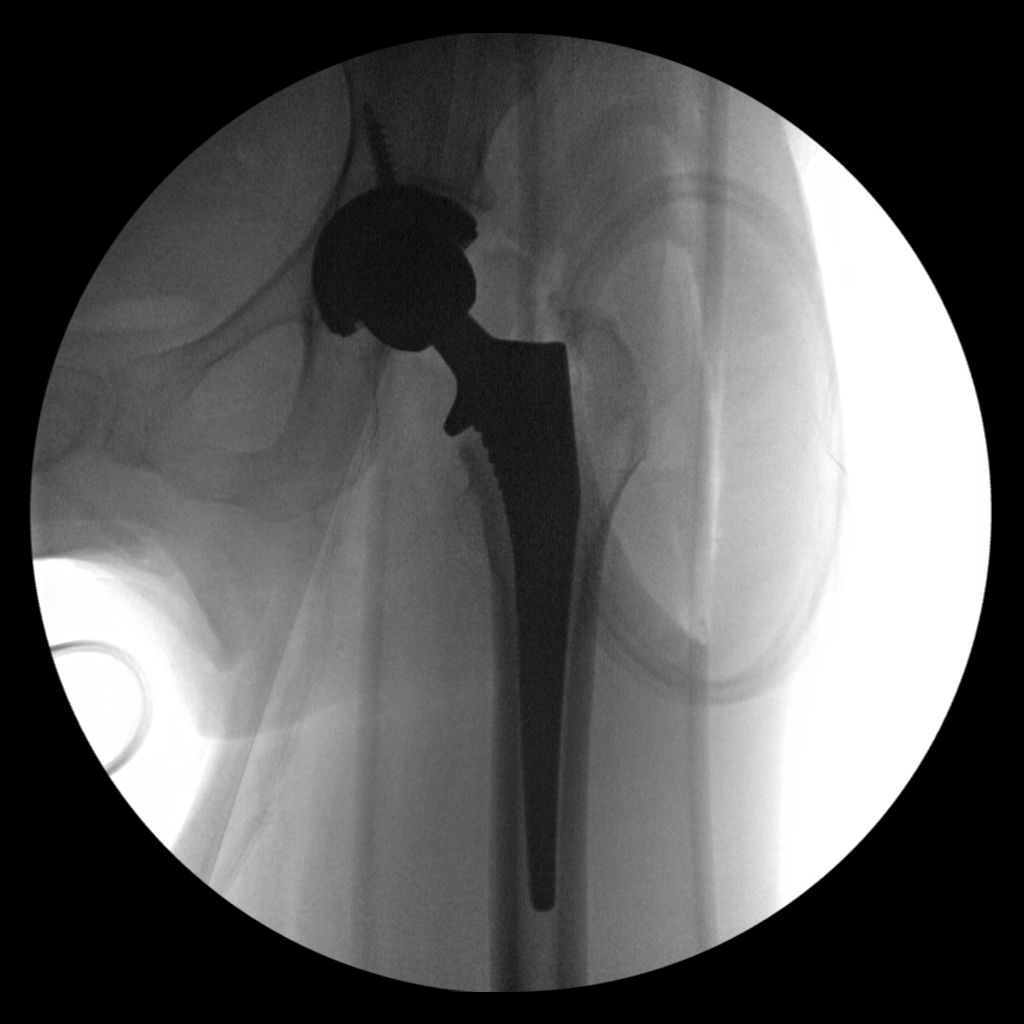
[im 2/2]
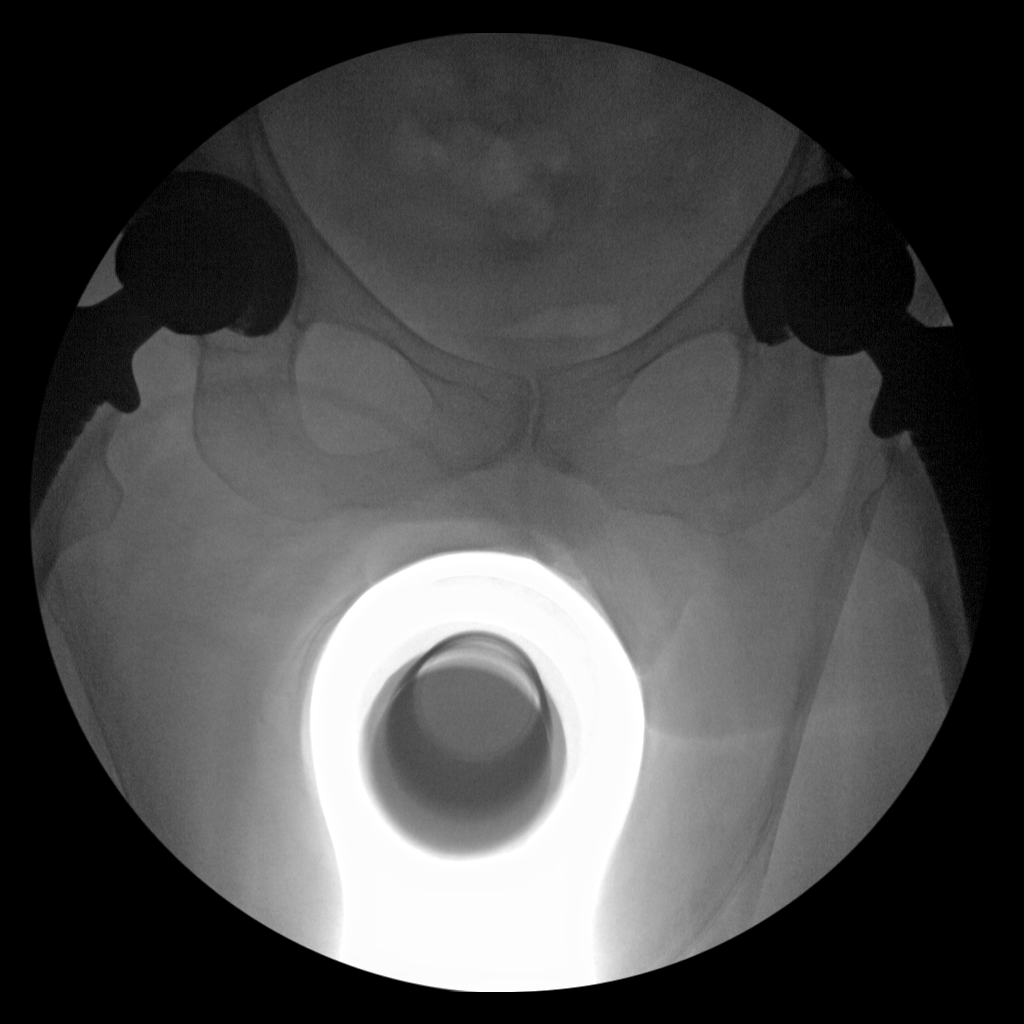

[2 of 2 positions shown; findings below may reference images not displayed]

FINDINGS: Two spot intraoperative fluoroscopic images of the left hip and
lower pelvis provided for review.

Images demonstrate the sequela of interval left total hip
replacement. Alignment appears anatomic on this solitary AP
projection. No definite fracture. Subcutaneous emphysema about the
operative site. No radiopaque foreign body.

Stable sequela of contralateral right total hip replacement,
incompletely imaged.
IMPRESSION: Post left total hip replacement without definite evidence of
complication.

## 2015-04-28 IMAGING — CR DG PORTABLE PELVIS
1 series · 1 of 1 positions shown · non-contrast
Comparison: Portable exam 7272 hr compared to intraoperative images
of 08/05/2013

CLINICAL DATA: Post left hip replacement

EXAM:
PORTABLE PELVIS 1-2 VIEWS

[AP]
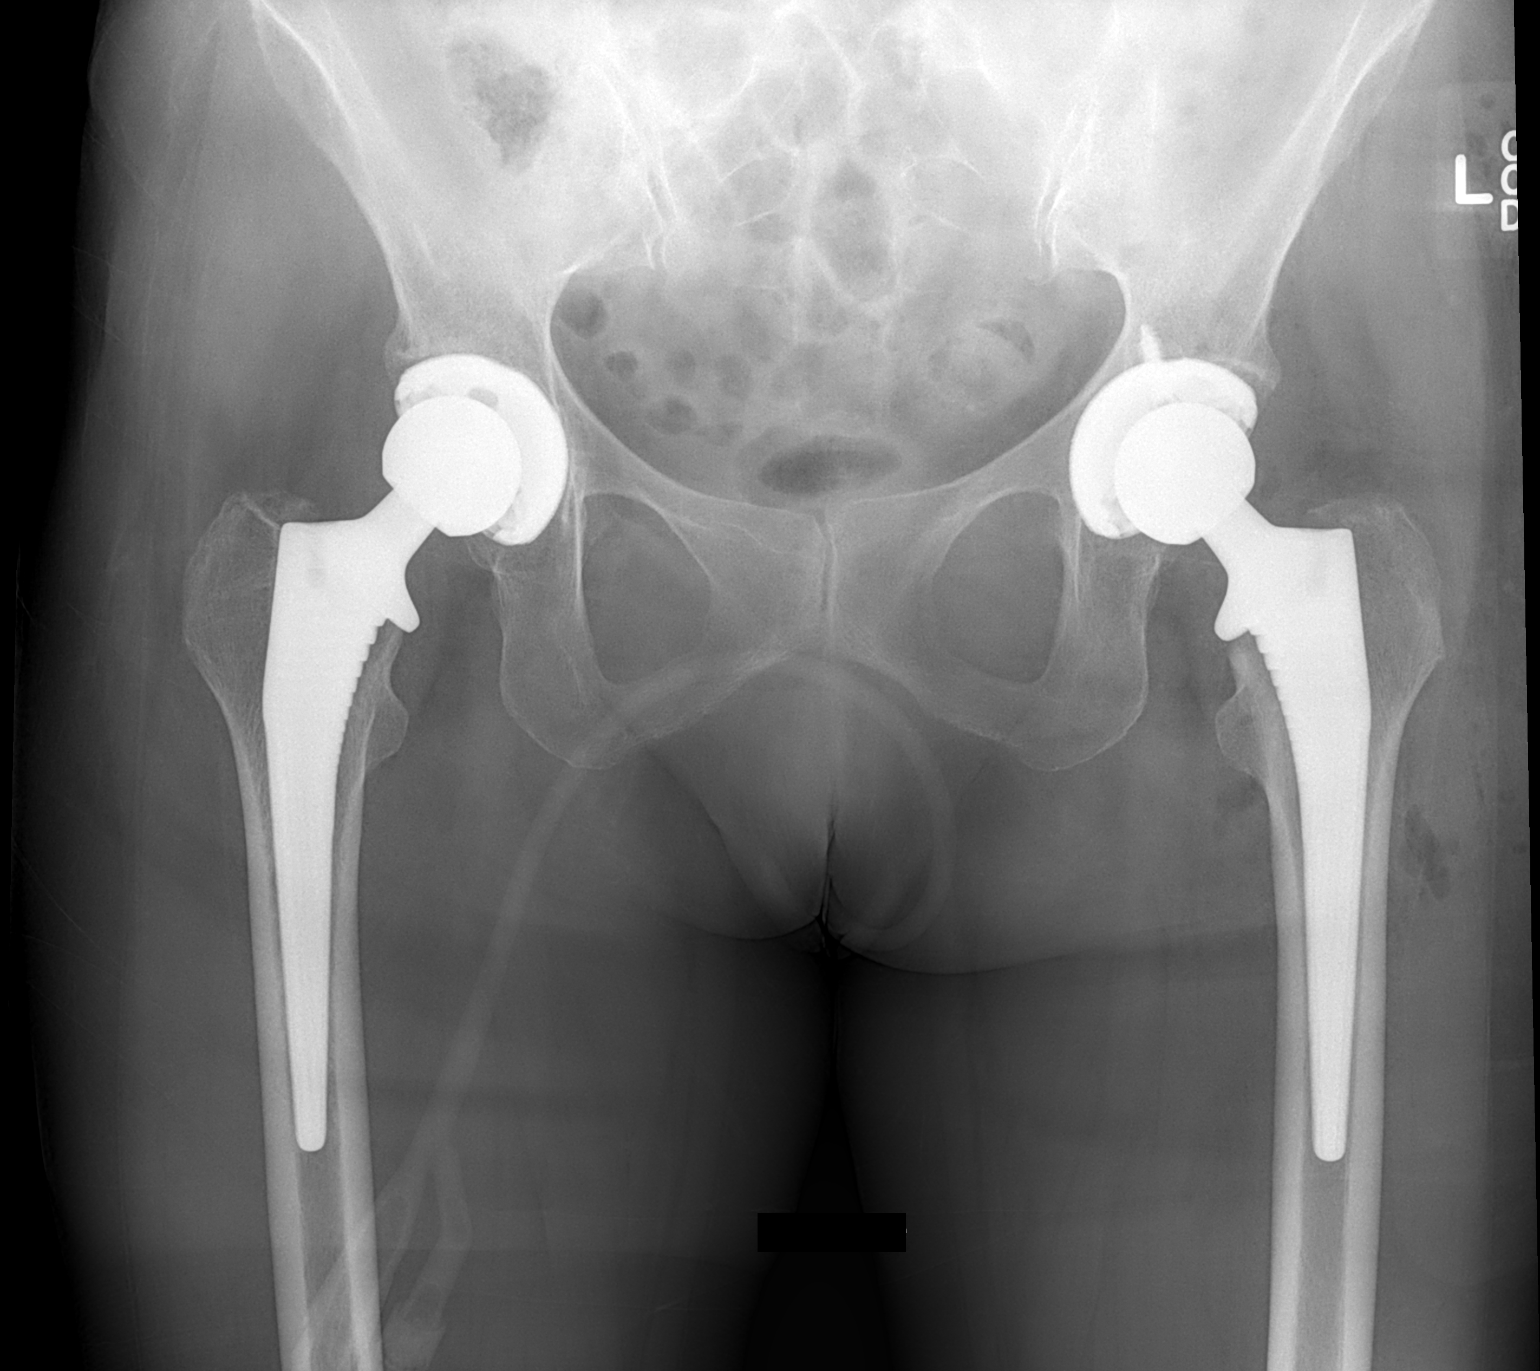

[1 of 1 positions shown; findings below may reference images not displayed]

FINDINGS: Bilateral hip prostheses identified in expected positions.

Bones appear mildly demineralized.

Expected postsurgical soft tissue changes at left hip region.

No acute fracture, dislocation, or bone destruction identified on
single AP view.

Superior aspect of pelvis excluded.
IMPRESSION: New left hip prosthesis without acute complication.

## 2016-01-21 ENCOUNTER — Encounter: Payer: Self-pay | Admitting: Gastroenterology

## 2017-01-05 ENCOUNTER — Telehealth (INDEPENDENT_AMBULATORY_CARE_PROVIDER_SITE_OTHER): Payer: Self-pay | Admitting: Orthopaedic Surgery

## 2017-01-05 NOTE — Telephone Encounter (Signed)
Patient called needing an antibiotic before she goes to the dentist The number to contact patient is [phone]

## 2017-01-05 NOTE — Telephone Encounter (Signed)
Patient aware she no longer needs Rx

## 2017-01-29 ENCOUNTER — Telehealth (INDEPENDENT_AMBULATORY_CARE_PROVIDER_SITE_OTHER): Payer: Self-pay | Admitting: Orthopaedic Surgery

## 2017-01-29 NOTE — Telephone Encounter (Signed)
PT REQUESTED A CALL BACK REGARDING AN ANTIBIOTIC FOR DENTAL APPT, SHE SAID HER DENTIST WANTS HER TO HAVE ONE OR HAVE A LETTER STATING NOT NEEDED.   [phone]

## 2017-01-30 NOTE — Telephone Encounter (Signed)
She does not need it from this standpoint based on new research.

## 2017-01-30 NOTE — Telephone Encounter (Signed)
Faxed note to her dental office letting them know we don't do premeds before dental appts

## 2017-01-30 NOTE — Telephone Encounter (Signed)
She states you told her to take antibiotics before dental appointments forever.?

## 2020-08-01 DIAGNOSIS — M9903 Segmental and somatic dysfunction of lumbar region: Secondary | ICD-10-CM | POA: Diagnosis not present

## 2020-08-01 DIAGNOSIS — M9904 Segmental and somatic dysfunction of sacral region: Secondary | ICD-10-CM | POA: Diagnosis not present

## 2020-08-01 DIAGNOSIS — M5136 Other intervertebral disc degeneration, lumbar region: Secondary | ICD-10-CM | POA: Diagnosis not present

## 2020-08-01 DIAGNOSIS — M9905 Segmental and somatic dysfunction of pelvic region: Secondary | ICD-10-CM | POA: Diagnosis not present

## 2020-08-07 DIAGNOSIS — M5136 Other intervertebral disc degeneration, lumbar region: Secondary | ICD-10-CM | POA: Diagnosis not present

## 2020-08-07 DIAGNOSIS — M9903 Segmental and somatic dysfunction of lumbar region: Secondary | ICD-10-CM | POA: Diagnosis not present

## 2020-08-07 DIAGNOSIS — M9905 Segmental and somatic dysfunction of pelvic region: Secondary | ICD-10-CM | POA: Diagnosis not present

## 2020-08-07 DIAGNOSIS — M9904 Segmental and somatic dysfunction of sacral region: Secondary | ICD-10-CM | POA: Diagnosis not present

## 2020-08-21 DIAGNOSIS — H2513 Age-related nuclear cataract, bilateral: Secondary | ICD-10-CM | POA: Diagnosis not present

## 2020-08-21 DIAGNOSIS — H16223 Keratoconjunctivitis sicca, not specified as Sjogren's, bilateral: Secondary | ICD-10-CM | POA: Diagnosis not present

## 2020-09-27 DIAGNOSIS — H2512 Age-related nuclear cataract, left eye: Secondary | ICD-10-CM | POA: Diagnosis not present

## 2021-01-21 DIAGNOSIS — E782 Mixed hyperlipidemia: Secondary | ICD-10-CM | POA: Diagnosis not present

## 2021-01-21 DIAGNOSIS — M8580 Other specified disorders of bone density and structure, unspecified site: Secondary | ICD-10-CM | POA: Diagnosis not present

## 2021-01-21 DIAGNOSIS — K219 Gastro-esophageal reflux disease without esophagitis: Secondary | ICD-10-CM | POA: Diagnosis not present

## 2021-01-21 DIAGNOSIS — Z86012 Personal history of benign carcinoid tumor: Secondary | ICD-10-CM | POA: Diagnosis not present

## 2021-01-21 DIAGNOSIS — M1990 Unspecified osteoarthritis, unspecified site: Secondary | ICD-10-CM | POA: Diagnosis not present

## 2021-01-21 DIAGNOSIS — K589 Irritable bowel syndrome without diarrhea: Secondary | ICD-10-CM | POA: Diagnosis not present

## 2021-01-21 DIAGNOSIS — Z0000 Encounter for general adult medical examination without abnormal findings: Secondary | ICD-10-CM | POA: Diagnosis not present

## 2021-07-08 DIAGNOSIS — M8588 Other specified disorders of bone density and structure, other site: Secondary | ICD-10-CM | POA: Diagnosis not present

## 2021-07-08 DIAGNOSIS — Z780 Asymptomatic menopausal state: Secondary | ICD-10-CM | POA: Diagnosis not present

## 2021-07-08 DIAGNOSIS — Z1231 Encounter for screening mammogram for malignant neoplasm of breast: Secondary | ICD-10-CM | POA: Diagnosis not present

## 2021-08-06 DIAGNOSIS — E7800 Pure hypercholesterolemia, unspecified: Secondary | ICD-10-CM | POA: Diagnosis not present

## 2021-08-29 DIAGNOSIS — R0989 Other specified symptoms and signs involving the circulatory and respiratory systems: Secondary | ICD-10-CM | POA: Diagnosis not present

## 2021-08-29 DIAGNOSIS — R059 Cough, unspecified: Secondary | ICD-10-CM | POA: Diagnosis not present

## 2022-01-22 DIAGNOSIS — R609 Edema, unspecified: Secondary | ICD-10-CM | POA: Diagnosis not present

## 2022-01-22 DIAGNOSIS — K589 Irritable bowel syndrome without diarrhea: Secondary | ICD-10-CM | POA: Diagnosis not present

## 2022-01-22 DIAGNOSIS — Z0000 Encounter for general adult medical examination without abnormal findings: Secondary | ICD-10-CM | POA: Diagnosis not present

## 2022-01-22 DIAGNOSIS — E782 Mixed hyperlipidemia: Secondary | ICD-10-CM | POA: Diagnosis not present

## 2022-01-22 DIAGNOSIS — M1990 Unspecified osteoarthritis, unspecified site: Secondary | ICD-10-CM | POA: Diagnosis not present

## 2022-01-22 DIAGNOSIS — Z23 Encounter for immunization: Secondary | ICD-10-CM | POA: Diagnosis not present

## 2022-01-22 DIAGNOSIS — Z86012 Personal history of benign carcinoid tumor: Secondary | ICD-10-CM | POA: Diagnosis not present

## 2022-01-22 DIAGNOSIS — M8580 Other specified disorders of bone density and structure, unspecified site: Secondary | ICD-10-CM | POA: Diagnosis not present

## 2022-01-22 DIAGNOSIS — K219 Gastro-esophageal reflux disease without esophagitis: Secondary | ICD-10-CM | POA: Diagnosis not present

## 2022-02-24 DIAGNOSIS — M7062 Trochanteric bursitis, left hip: Secondary | ICD-10-CM | POA: Diagnosis not present

## 2022-02-24 DIAGNOSIS — M25551 Pain in right hip: Secondary | ICD-10-CM | POA: Diagnosis not present

## 2022-06-02 DIAGNOSIS — M9905 Segmental and somatic dysfunction of pelvic region: Secondary | ICD-10-CM | POA: Diagnosis not present

## 2022-06-02 DIAGNOSIS — Z23 Encounter for immunization: Secondary | ICD-10-CM | POA: Diagnosis not present

## 2022-06-02 DIAGNOSIS — M9904 Segmental and somatic dysfunction of sacral region: Secondary | ICD-10-CM | POA: Diagnosis not present

## 2022-06-02 DIAGNOSIS — M5136 Other intervertebral disc degeneration, lumbar region: Secondary | ICD-10-CM | POA: Diagnosis not present

## 2022-06-02 DIAGNOSIS — M9903 Segmental and somatic dysfunction of lumbar region: Secondary | ICD-10-CM | POA: Diagnosis not present

## 2022-06-18 DIAGNOSIS — R059 Cough, unspecified: Secondary | ICD-10-CM | POA: Diagnosis not present

## 2022-06-18 DIAGNOSIS — J069 Acute upper respiratory infection, unspecified: Secondary | ICD-10-CM | POA: Diagnosis not present

## 2022-06-18 DIAGNOSIS — Z03818 Encounter for observation for suspected exposure to other biological agents ruled out: Secondary | ICD-10-CM | POA: Diagnosis not present

## 2022-07-14 DIAGNOSIS — Z1231 Encounter for screening mammogram for malignant neoplasm of breast: Secondary | ICD-10-CM | POA: Diagnosis not present

## 2022-07-21 DIAGNOSIS — R921 Mammographic calcification found on diagnostic imaging of breast: Secondary | ICD-10-CM | POA: Diagnosis not present

## 2022-07-29 ENCOUNTER — Other Ambulatory Visit: Payer: Self-pay | Admitting: Radiology

## 2022-07-29 DIAGNOSIS — R921 Mammographic calcification found on diagnostic imaging of breast: Secondary | ICD-10-CM | POA: Diagnosis not present

## 2022-07-29 DIAGNOSIS — N6012 Diffuse cystic mastopathy of left breast: Secondary | ICD-10-CM | POA: Diagnosis not present

## 2023-02-10 DIAGNOSIS — K219 Gastro-esophageal reflux disease without esophagitis: Secondary | ICD-10-CM | POA: Diagnosis not present

## 2023-02-10 DIAGNOSIS — Z0000 Encounter for general adult medical examination without abnormal findings: Secondary | ICD-10-CM | POA: Diagnosis not present

## 2023-02-10 DIAGNOSIS — K589 Irritable bowel syndrome without diarrhea: Secondary | ICD-10-CM | POA: Diagnosis not present

## 2023-02-10 DIAGNOSIS — Z86012 Personal history of benign carcinoid tumor: Secondary | ICD-10-CM | POA: Diagnosis not present

## 2023-02-10 DIAGNOSIS — M8580 Other specified disorders of bone density and structure, unspecified site: Secondary | ICD-10-CM | POA: Diagnosis not present

## 2023-02-10 DIAGNOSIS — M1990 Unspecified osteoarthritis, unspecified site: Secondary | ICD-10-CM | POA: Diagnosis not present

## 2023-02-10 DIAGNOSIS — G43909 Migraine, unspecified, not intractable, without status migrainosus: Secondary | ICD-10-CM | POA: Diagnosis not present

## 2023-02-10 DIAGNOSIS — E782 Mixed hyperlipidemia: Secondary | ICD-10-CM | POA: Diagnosis not present

## 2023-05-05 DIAGNOSIS — Z23 Encounter for immunization: Secondary | ICD-10-CM | POA: Diagnosis not present

## 2023-06-06 DIAGNOSIS — R051 Acute cough: Secondary | ICD-10-CM | POA: Diagnosis not present

## 2023-06-06 DIAGNOSIS — J040 Acute laryngitis: Secondary | ICD-10-CM | POA: Diagnosis not present

## 2023-06-12 DIAGNOSIS — G43909 Migraine, unspecified, not intractable, without status migrainosus: Secondary | ICD-10-CM | POA: Diagnosis not present

## 2023-06-12 DIAGNOSIS — R140 Abdominal distension (gaseous): Secondary | ICD-10-CM | POA: Diagnosis not present

## 2023-07-01 ENCOUNTER — Ambulatory Visit (INDEPENDENT_AMBULATORY_CARE_PROVIDER_SITE_OTHER): Payer: Medicare Other | Admitting: Orthopaedic Surgery

## 2023-07-01 ENCOUNTER — Other Ambulatory Visit (INDEPENDENT_AMBULATORY_CARE_PROVIDER_SITE_OTHER): Payer: Medicare Other

## 2023-07-01 DIAGNOSIS — M5416 Radiculopathy, lumbar region: Secondary | ICD-10-CM

## 2023-07-01 DIAGNOSIS — M7061 Trochanteric bursitis, right hip: Secondary | ICD-10-CM | POA: Diagnosis not present

## 2023-07-01 DIAGNOSIS — M51369 Other intervertebral disc degeneration, lumbar region without mention of lumbar back pain or lower extremity pain: Secondary | ICD-10-CM

## 2023-07-01 DIAGNOSIS — Z96643 Presence of artificial hip joint, bilateral: Secondary | ICD-10-CM | POA: Diagnosis not present

## 2023-07-01 MED ORDER — METHYLPREDNISOLONE ACETATE 40 MG/ML IJ SUSP
40.0000 mg | INTRAMUSCULAR | Status: AC | PRN
  Administered 2023-07-01: 40 mg via INTRA_ARTICULAR

## 2023-07-01 MED ORDER — LIDOCAINE HCL 1 % IJ SOLN
3.0000 mL | INTRAMUSCULAR | Status: AC | PRN
  Administered 2023-07-01: 3 mL

## 2023-07-01 NOTE — Progress Notes (Signed)
 Office Visit Note   Patient: [name]           Date of Birth: 01/22/1946           MRN: [id number] Visit Date: 07/01/2023              Requested by: Glena Landau, MD [address] [place],  [place] [address] PCP: Glena Landau, MD   Assessment & Plan: Visit Diagnoses:  1. H/O bilateral hip replacements   2. Radiculopathy, lumbar region   3. Trochanteric bursitis, right hip   4. Degeneration of intervertebral disc of lumbar region, unspecified whether pain present     Plan: She is shown IT band stretching exercises and quad strengthening exercises.  Will send her to formal physical therapy for core strengthening, home exercise program, IT band stretching, hamstring stretching, modalities and back exercises.  She will follow-up with us  in 6 weeks see how she is doing overall.  Questions were encouraged and answered by Dr. Lucienne Ryder and myself.  Radiographs reviewed with the patient.  It  Follow-Up Instructions: Return in about 6 weeks (around 08/12/2023).   Orders:  Orders Placed This Encounter  Procedures   Large Joint Inj: R greater trochanter   XR HIPS BILAT W OR W/O PELVIS 2V   XR Lumbar Spine 2-3 Views   No orders of the defined types were placed in this encounter.     Procedures: Large Joint Inj: R greater trochanter on 07/01/2023 10:46 AM Indications: pain Details: 22 G 1.5 in needle, lateral approach  Arthrogram: No  Medications: 3 mL lidocaine  1 %; 40 mg methylPREDNISolone  acetate 40 MG/ML Outcome: tolerated well, no immediate complications Procedure, treatment alternatives, risks and benefits explained, specific risks discussed. Consent was given by the patient. Immediately prior to procedure a time out was called to verify the correct patient, procedure, equipment, support staff and site/side marked as required. Patient was prepped and draped in the usual sterile fashion.       Clinical Data: No additional  findings.   Subjective: Chief Complaint  Patient presents with   Left Hip - Pain   Right Hip - Pain    HPI [name] well-known to Dr. Alvester Johnson service but has not been seen in years.  She comes in today with bilateral hip pain.  States she is having pain in both hips lateral aspect.  Describes the pain to be 7-8 out of 10 bilaterally.  However the right hip is worse whenever she lies on it at night.  Right hip does awaken her at night.  She also has some numbness tingling from the right knee down into her right foot.  Notes some back pain.  Denies any saddle anesthesia like symptoms, fevers/chills or bowel or bladder dysfunction.  She states that her back pain is worse with sitting.  She also states that the right hip pain that is bothersome whenever she first initiates ambulation but walks out soon.  She has had no known injury.  History of left total hip arthroplasty by Dr. Lucienne Ryder 08/05/2013 and right total hip arthroplasty 09/17/2012.  Review of Systems  Constitutional:  Negative for chills and fever.  Musculoskeletal:  Positive for back pain.     Objective: Vital Signs: There were no vitals taken for this visit.  Physical Exam Constitutional:      Appearance: She is normal weight. She is not ill-appearing or diaphoretic.  Cardiovascular:     Pulses: Normal pulses.  Pulmonary:  Effort: Pulmonary effort is normal.  Neurological:     Mental Status: She is alert and oriented to person, place, and time.  Psychiatric:        Mood and Affect: Mood normal.     Ortho Exam Lower extremity strength testing 5 out of 5 strength throughout.  Positive straight leg raise on the right negative on the left.  Exquisitely tight hamstrings bilaterally.  Dorsal pedal pulses are 2+ and equal symmetric bilaterally.  Sensation subjectively intact bilateral feet to light touch. Bilateral hips excellent range of motion without pain.  Tenderness over the trochanteric region both hips right greater  than left.  Bilateral knees good range of motion no abnormal warmth erythema or effusion of either knee.  Patellofemoral crepitus bilateral knees right greater than left.  Lumbar spine: Comes within a foot of being able to touch her toes with forward flexion.  No pain with extension or flexion lumbar spine.    Specialty Comments:  No specialty comments available.  Imaging: XR Lumbar Spine 2-3 Views Result Date: 07/01/2023 Lumbar spine 2 views: Loss of lordotic curvature.  Degenerative disc disease at L4-3 and L4-5.  No spondylolisthesis.  No acute fractures acute findings.  Facet changes lower lumbar spine.  XR HIPS BILAT W OR W/O PELVIS 2V Result Date: 07/01/2023 AP pelvis lateral view bilateral hips: Bilateral hips well located.  No evidence of loosening or hardware failure bilateral hips.  No acute fractures or acute findings.    PMFS History: Patient Active Problem List   Diagnosis Date Noted   Arthritis of left hip 08/05/2013   Status post THR (total hip replacement) 08/05/2013   Closed fracture of left distal radius 05/24/2013   Distal radius fracture 05/24/2013   Degenerative arthritis of hip 09/17/2012   CONSTIPATION 05/30/2009   FLATULENCE-GAS-BLOATING 05/30/2009   INTERNAL HEMORRHOIDS 05/24/2009   GERD 05/24/2009   HIATAL HERNIA 05/24/2009   IRRITABLE BOWEL SYNDROME 05/24/2009   OSTEOARTHRITIS 05/24/2009   Past Medical History:  Diagnosis Date   Anxiety    Arthritis    GERD (gastroesophageal reflux disease)    H/O hiatal hernia    Osteopenia     No family history on file.  Past Surgical History:  Procedure Laterality Date   ABDOMINAL HYSTERECTOMY  2001   COLONOSCOPY     LAPAROSCOPY     MANY YRS AGO   OPEN REDUCTION INTERNAL FIXATION (ORIF) DISTAL RADIAL FRACTURE Left 05/24/2013   Procedure: OPEN REDUCTION INTERNAL FIXATION (ORIF) LEFT DISTAL RADIUS FRACTURE;  Surgeon: Arnie Lao, MD;  Location: [facility] OR;  Service: Orthopedics;  Laterality: Left;    TOTAL HIP ARTHROPLASTY Right 09/17/2012   Procedure: RIGHT TOTAL HIP ARTHROPLASTY ANTERIOR APPROACH;  Surgeon: Arnie Lao, MD;  Location: [facility]S;  Service: Orthopedics;  Laterality: Right;   TOTAL HIP ARTHROPLASTY Left 08/05/2013   Procedure: LEFT TOTAL HIP ARTHROPLASTY ANTERIOR APPROACH;  Surgeon: Arnie Lao, MD;  Location: [facility];  Service: Orthopedics;  Laterality: Left;   TUBAL LIGATION     Social History   Occupational History   Not on file  Tobacco Use   Smoking status: Former    Current packs/day: 0.00    Types: Cigarettes    Quit date: 06/12/2012    Years since quitting: 11.0   Smokeless tobacco: Never  Substance and Sexual Activity   Alcohol use: Yes    Comment: SOCIAL   Drug use: No   Sexual activity: Not on file

## 2023-07-20 DIAGNOSIS — Z1231 Encounter for screening mammogram for malignant neoplasm of breast: Secondary | ICD-10-CM | POA: Diagnosis not present

## 2023-07-21 DIAGNOSIS — M25551 Pain in right hip: Secondary | ICD-10-CM | POA: Diagnosis not present

## 2023-07-21 DIAGNOSIS — M7062 Trochanteric bursitis, left hip: Secondary | ICD-10-CM | POA: Diagnosis not present

## 2023-07-21 DIAGNOSIS — M545 Low back pain, unspecified: Secondary | ICD-10-CM | POA: Diagnosis not present

## 2023-07-27 DIAGNOSIS — G43909 Migraine, unspecified, not intractable, without status migrainosus: Secondary | ICD-10-CM | POA: Diagnosis not present

## 2023-07-27 DIAGNOSIS — M1990 Unspecified osteoarthritis, unspecified site: Secondary | ICD-10-CM | POA: Diagnosis not present

## 2023-07-27 DIAGNOSIS — Z23 Encounter for immunization: Secondary | ICD-10-CM | POA: Diagnosis not present

## 2023-07-27 DIAGNOSIS — R140 Abdominal distension (gaseous): Secondary | ICD-10-CM | POA: Diagnosis not present

## 2023-08-10 ENCOUNTER — Ambulatory Visit: Payer: Medicare Other | Admitting: Orthopaedic Surgery

## 2023-08-14 DIAGNOSIS — N6489 Other specified disorders of breast: Secondary | ICD-10-CM | POA: Diagnosis not present

## 2023-08-14 DIAGNOSIS — R92322 Mammographic fibroglandular density, left breast: Secondary | ICD-10-CM | POA: Diagnosis not present

## 2023-08-14 DIAGNOSIS — R922 Inconclusive mammogram: Secondary | ICD-10-CM | POA: Diagnosis not present

## 2024-01-14 DIAGNOSIS — M1990 Unspecified osteoarthritis, unspecified site: Secondary | ICD-10-CM | POA: Diagnosis not present

## 2024-01-14 DIAGNOSIS — E782 Mixed hyperlipidemia: Secondary | ICD-10-CM | POA: Diagnosis not present

## 2024-02-14 DIAGNOSIS — M1990 Unspecified osteoarthritis, unspecified site: Secondary | ICD-10-CM | POA: Diagnosis not present

## 2024-02-14 DIAGNOSIS — E782 Mixed hyperlipidemia: Secondary | ICD-10-CM | POA: Diagnosis not present

## 2024-02-17 DIAGNOSIS — M8580 Other specified disorders of bone density and structure, unspecified site: Secondary | ICD-10-CM | POA: Diagnosis not present

## 2024-02-17 DIAGNOSIS — G43909 Migraine, unspecified, not intractable, without status migrainosus: Secondary | ICD-10-CM | POA: Diagnosis not present

## 2024-02-17 DIAGNOSIS — M1990 Unspecified osteoarthritis, unspecified site: Secondary | ICD-10-CM | POA: Diagnosis not present

## 2024-02-17 DIAGNOSIS — Z0000 Encounter for general adult medical examination without abnormal findings: Secondary | ICD-10-CM | POA: Diagnosis not present

## 2024-02-17 DIAGNOSIS — K219 Gastro-esophageal reflux disease without esophagitis: Secondary | ICD-10-CM | POA: Diagnosis not present

## 2024-02-17 DIAGNOSIS — Z86012 Personal history of benign carcinoid tumor: Secondary | ICD-10-CM | POA: Diagnosis not present

## 2024-02-17 DIAGNOSIS — Z131 Encounter for screening for diabetes mellitus: Secondary | ICD-10-CM | POA: Diagnosis not present

## 2024-02-17 DIAGNOSIS — E782 Mixed hyperlipidemia: Secondary | ICD-10-CM | POA: Diagnosis not present

## 2024-03-15 DIAGNOSIS — E782 Mixed hyperlipidemia: Secondary | ICD-10-CM | POA: Diagnosis not present

## 2024-03-15 DIAGNOSIS — M1990 Unspecified osteoarthritis, unspecified site: Secondary | ICD-10-CM | POA: Diagnosis not present
